# Patient Record
Sex: Female | Born: 1965 | Race: Asian | Hispanic: No | Marital: Single | Smoking: Never smoker
Health system: Southern US, Community
[De-identification: ages and names within clinical notes are randomized; demographics above are authoritative.]

## PROBLEM LIST (undated history)

## (undated) DIAGNOSIS — K219 Gastro-esophageal reflux disease without esophagitis: Secondary | ICD-10-CM

## (undated) DIAGNOSIS — K2970 Gastritis, unspecified, without bleeding: Secondary | ICD-10-CM

## (undated) DIAGNOSIS — M545 Low back pain, unspecified: Secondary | ICD-10-CM

## (undated) DIAGNOSIS — G4700 Insomnia, unspecified: Secondary | ICD-10-CM

## (undated) DIAGNOSIS — T7840XA Allergy, unspecified, initial encounter: Secondary | ICD-10-CM

## (undated) DIAGNOSIS — Z8619 Personal history of other infectious and parasitic diseases: Secondary | ICD-10-CM

## (undated) DIAGNOSIS — R5600 Simple febrile convulsions: Secondary | ICD-10-CM

## (undated) DIAGNOSIS — Z8719 Personal history of other diseases of the digestive system: Secondary | ICD-10-CM

## (undated) DIAGNOSIS — K648 Other hemorrhoids: Secondary | ICD-10-CM

## (undated) DIAGNOSIS — N809 Endometriosis, unspecified: Secondary | ICD-10-CM

## (undated) DIAGNOSIS — R55 Syncope and collapse: Secondary | ICD-10-CM

## (undated) HISTORY — DX: Endometriosis, unspecified: N80.9

## (undated) HISTORY — DX: Other hemorrhoids: K64.8

## (undated) HISTORY — DX: Personal history of other diseases of the digestive system: Z87.19

## (undated) HISTORY — DX: Syncope and collapse: R55

## (undated) HISTORY — DX: Low back pain: M54.5

## (undated) HISTORY — DX: Simple febrile convulsions: R56.00

## (undated) HISTORY — DX: Gastritis, unspecified, without bleeding: K29.70

## (undated) HISTORY — DX: Low back pain, unspecified: M54.50

## (undated) HISTORY — PX: ABDOMINAL HYSTERECTOMY: SHX81

## (undated) HISTORY — DX: Allergy, unspecified, initial encounter: T78.40XA

## (undated) HISTORY — DX: Personal history of other infectious and parasitic diseases: Z86.19

## (undated) HISTORY — DX: Gastro-esophageal reflux disease without esophagitis: K21.9

## (undated) HISTORY — DX: Insomnia, unspecified: G47.00

---

## 2001-12-12 ENCOUNTER — Other Ambulatory Visit: Admission: RE | Admit: 2001-12-12 | Discharge: 2001-12-12 | Payer: Self-pay | Admitting: Obstetrics and Gynecology

## 2003-03-05 ENCOUNTER — Other Ambulatory Visit: Admission: RE | Admit: 2003-03-05 | Discharge: 2003-03-05 | Payer: Self-pay | Admitting: Obstetrics and Gynecology

## 2004-09-21 HISTORY — PX: LAPAROSCOPY: SHX197

## 2005-05-04 ENCOUNTER — Other Ambulatory Visit: Admission: RE | Admit: 2005-05-04 | Discharge: 2005-05-04 | Payer: Self-pay | Admitting: Obstetrics and Gynecology

## 2005-06-02 ENCOUNTER — Encounter (INDEPENDENT_AMBULATORY_CARE_PROVIDER_SITE_OTHER): Payer: Self-pay | Admitting: *Deleted

## 2005-06-02 ENCOUNTER — Ambulatory Visit (HOSPITAL_COMMUNITY): Admission: RE | Admit: 2005-06-02 | Discharge: 2005-06-02 | Payer: Self-pay | Admitting: Obstetrics and Gynecology

## 2005-06-17 ENCOUNTER — Ambulatory Visit: Payer: Self-pay | Admitting: Internal Medicine

## 2005-07-20 ENCOUNTER — Ambulatory Visit: Payer: Self-pay | Admitting: Internal Medicine

## 2005-08-14 ENCOUNTER — Ambulatory Visit (HOSPITAL_COMMUNITY): Admission: RE | Admit: 2005-08-14 | Discharge: 2005-08-14 | Payer: Self-pay | Admitting: Gastroenterology

## 2005-09-04 ENCOUNTER — Encounter (INDEPENDENT_AMBULATORY_CARE_PROVIDER_SITE_OTHER): Payer: Self-pay | Admitting: *Deleted

## 2005-09-04 ENCOUNTER — Ambulatory Visit (HOSPITAL_COMMUNITY): Admission: RE | Admit: 2005-09-04 | Discharge: 2005-09-04 | Payer: Self-pay | Admitting: Gastroenterology

## 2005-09-04 LAB — HM COLONOSCOPY

## 2005-09-10 ENCOUNTER — Ambulatory Visit: Payer: Self-pay | Admitting: Internal Medicine

## 2005-09-13 ENCOUNTER — Encounter: Admission: RE | Admit: 2005-09-13 | Discharge: 2005-09-13 | Payer: Self-pay | Admitting: Internal Medicine

## 2006-02-04 ENCOUNTER — Ambulatory Visit: Payer: Self-pay | Admitting: Internal Medicine

## 2006-02-09 ENCOUNTER — Ambulatory Visit: Payer: Self-pay | Admitting: Internal Medicine

## 2006-03-17 ENCOUNTER — Ambulatory Visit: Payer: Self-pay | Admitting: Internal Medicine

## 2006-05-13 ENCOUNTER — Other Ambulatory Visit: Admission: RE | Admit: 2006-05-13 | Discharge: 2006-05-13 | Payer: Self-pay | Admitting: Obstetrics and Gynecology

## 2006-06-17 ENCOUNTER — Ambulatory Visit: Payer: Self-pay | Admitting: Internal Medicine

## 2006-07-01 ENCOUNTER — Ambulatory Visit: Payer: Self-pay | Admitting: Internal Medicine

## 2007-07-23 LAB — CONVERTED CEMR LAB

## 2007-08-10 ENCOUNTER — Encounter: Admission: RE | Admit: 2007-08-10 | Discharge: 2007-08-10 | Payer: Self-pay | Admitting: Obstetrics and Gynecology

## 2007-12-03 ENCOUNTER — Encounter: Payer: Self-pay | Admitting: *Deleted

## 2007-12-03 DIAGNOSIS — R5600 Simple febrile convulsions: Secondary | ICD-10-CM | POA: Insufficient documentation

## 2007-12-03 DIAGNOSIS — Z87448 Personal history of other diseases of urinary system: Secondary | ICD-10-CM | POA: Insufficient documentation

## 2007-12-03 DIAGNOSIS — Z862 Personal history of diseases of the blood and blood-forming organs and certain disorders involving the immune mechanism: Secondary | ICD-10-CM

## 2007-12-03 DIAGNOSIS — Z8719 Personal history of other diseases of the digestive system: Secondary | ICD-10-CM | POA: Insufficient documentation

## 2007-12-03 DIAGNOSIS — M545 Low back pain, unspecified: Secondary | ICD-10-CM | POA: Insufficient documentation

## 2007-12-03 DIAGNOSIS — G4700 Insomnia, unspecified: Secondary | ICD-10-CM | POA: Insufficient documentation

## 2007-12-03 DIAGNOSIS — R55 Syncope and collapse: Secondary | ICD-10-CM

## 2008-04-12 ENCOUNTER — Ambulatory Visit: Payer: Self-pay | Admitting: Internal Medicine

## 2008-04-12 LAB — CONVERTED CEMR LAB
ALT: 16 units/L (ref 0–35)
AST: 19 units/L (ref 0–37)
Albumin: 3.8 g/dL (ref 3.5–5.2)
Alkaline Phosphatase: 38 units/L — ABNORMAL LOW (ref 39–117)
BUN: 11 mg/dL (ref 6–23)
Bacteria, UA: NEGATIVE
Basophils Absolute: 0 10*3/uL (ref 0.0–0.1)
Basophils Relative: 0.8 % (ref 0.0–3.0)
Bilirubin Urine: NEGATIVE
Bilirubin, Direct: 0.1 mg/dL (ref 0.0–0.3)
CO2: 29 meq/L (ref 19–32)
Calcium: 8.5 mg/dL (ref 8.4–10.5)
Chloride: 102 meq/L (ref 96–112)
Cholesterol: 202 mg/dL (ref 0–200)
Creatinine, Ser: 0.7 mg/dL (ref 0.4–1.2)
Crystals: NEGATIVE
Direct LDL: 123.3 mg/dL
Eosinophils Absolute: 0.2 10*3/uL (ref 0.0–0.7)
Eosinophils Relative: 3 % (ref 0.0–5.0)
GFR calc Af Amer: 119 mL/min
GFR calc non Af Amer: 99 mL/min
Glucose, Bld: 93 mg/dL (ref 70–99)
HCT: 38.6 % (ref 36.0–46.0)
HDL: 71.6 mg/dL (ref >39.0)
Hemoglobin: 13.4 g/dL (ref 12.0–15.0)
Ketones, ur: NEGATIVE mg/dL
Leukocytes, UA: NEGATIVE
Lymphocytes Relative: 33.9 % (ref 12.0–46.0)
MCHC: 34.8 g/dL (ref 30.0–36.0)
MCV: 91.7 fL (ref 78.0–100.0)
Monocytes Absolute: 0.5 10*3/uL (ref 0.1–1.0)
Monocytes Relative: 8.4 % (ref 3.0–12.0)
Mucus, UA: NEGATIVE
Neutro Abs: 3.2 10*3/uL (ref 1.4–7.7)
Neutrophils Relative %: 53.9 % (ref 43.0–77.0)
Nitrite: NEGATIVE
Platelets: 254 10*3/uL (ref 150–400)
Potassium: 4.1 meq/L (ref 3.5–5.1)
RBC: 4.21 M/uL (ref 3.87–5.11)
RDW: 11.8 % (ref 11.5–14.6)
Sodium: 138 meq/L (ref 135–145)
Specific Gravity, Urine: <=1.005 (ref 1.000–1.03)
TSH: 3.49 microintl units/mL (ref 0.35–5.50)
Total Bilirubin: 0.7 mg/dL (ref 0.3–1.2)
Total CHOL/HDL Ratio: 2.8
Total Protein, Urine: NEGATIVE mg/dL
Total Protein: 7 g/dL (ref 6.0–8.3)
Triglycerides: 62 mg/dL (ref 0–149)
Urine Glucose: NEGATIVE mg/dL
Urobilinogen, UA: 0.2 (ref 0.0–1.0)
VLDL: 12 mg/dL (ref 0–40)
WBC, UA: NONE SEEN cells/hpf
WBC: 5.9 10*3/uL (ref 4.5–10.5)
pH: 7 (ref 5.0–8.0)

## 2008-04-17 ENCOUNTER — Ambulatory Visit: Payer: Self-pay | Admitting: Internal Medicine

## 2008-08-10 ENCOUNTER — Encounter: Admission: RE | Admit: 2008-08-10 | Discharge: 2008-08-10 | Payer: Self-pay | Admitting: Obstetrics and Gynecology

## 2008-09-06 LAB — HM MAMMOGRAPHY: HM Mammogram: NORMAL

## 2008-12-20 HISTORY — PX: ROBOTIC ASSISTED LAP VAGINAL HYSTERECTOMY: SHX2362

## 2009-01-08 ENCOUNTER — Encounter (INDEPENDENT_AMBULATORY_CARE_PROVIDER_SITE_OTHER): Payer: Self-pay | Admitting: Obstetrics and Gynecology

## 2009-01-08 ENCOUNTER — Ambulatory Visit (HOSPITAL_COMMUNITY): Admission: RE | Admit: 2009-01-08 | Discharge: 2009-01-08 | Payer: Self-pay | Admitting: Obstetrics and Gynecology

## 2009-01-10 ENCOUNTER — Inpatient Hospital Stay (HOSPITAL_COMMUNITY): Admission: AD | Admit: 2009-01-10 | Discharge: 2009-01-10 | Payer: Self-pay | Admitting: Obstetrics and Gynecology

## 2009-02-26 ENCOUNTER — Encounter: Payer: Self-pay | Admitting: Internal Medicine

## 2009-03-15 ENCOUNTER — Encounter: Payer: Self-pay | Admitting: Internal Medicine

## 2009-03-15 ENCOUNTER — Ambulatory Visit (HOSPITAL_COMMUNITY): Admission: RE | Admit: 2009-03-15 | Discharge: 2009-03-15 | Payer: Self-pay | Admitting: Gastroenterology

## 2009-04-19 ENCOUNTER — Ambulatory Visit: Payer: Self-pay | Admitting: Internal Medicine

## 2009-04-19 LAB — CONVERTED CEMR LAB
ALT: 19 units/L (ref 0–35)
AST: 17 units/L (ref 0–37)
Albumin: 4.1 g/dL (ref 3.5–5.2)
Alkaline Phosphatase: 44 units/L (ref 39–117)
BUN: 13 mg/dL (ref 6–23)
Basophils Absolute: 0 10*3/uL (ref 0.0–0.1)
Basophils Relative: 0.1 % (ref 0.0–3.0)
Bilirubin Urine: NEGATIVE
Bilirubin, Direct: 0.1 mg/dL (ref 0.0–0.3)
CO2: 28 meq/L (ref 19–32)
Calcium: 9 mg/dL (ref 8.4–10.5)
Chloride: 103 meq/L (ref 96–112)
Cholesterol: 203 mg/dL — ABNORMAL HIGH (ref 0–200)
Creatinine, Ser: 0.6 mg/dL (ref 0.4–1.2)
Direct LDL: 124.4 mg/dL
Eosinophils Absolute: 0.3 10*3/uL (ref 0.0–0.7)
Eosinophils Relative: 4.6 % (ref 0.0–5.0)
GFR calc non Af Amer: 116.61 mL/min (ref >60)
Glucose, Bld: 80 mg/dL (ref 70–99)
HCT: 39.2 % (ref 36.0–46.0)
HDL: 65.2 mg/dL (ref >39.00)
Hemoglobin: 13.3 g/dL (ref 12.0–15.0)
Ketones, ur: NEGATIVE mg/dL
Lymphocytes Relative: 32.5 % (ref 12.0–46.0)
Lymphs Abs: 2.1 10*3/uL (ref 0.7–4.0)
MCHC: 33.8 g/dL (ref 30.0–36.0)
MCV: 91.1 fL (ref 78.0–100.0)
Monocytes Absolute: 0.5 10*3/uL (ref 0.1–1.0)
Monocytes Relative: 7.3 % (ref 3.0–12.0)
Neutro Abs: 3.7 10*3/uL (ref 1.4–7.7)
Neutrophils Relative %: 55.5 % (ref 43.0–77.0)
Nitrite: NEGATIVE
Platelets: 278 10*3/uL (ref 150.0–400.0)
Potassium: 3.8 meq/L (ref 3.5–5.1)
RBC: 4.3 M/uL (ref 3.87–5.11)
RDW: 12.7 % (ref 11.5–14.6)
Sodium: 142 meq/L (ref 135–145)
Specific Gravity, Urine: <=1.005 (ref 1.000–1.030)
TSH: 3.12 microintl units/mL (ref 0.35–5.50)
Total Bilirubin: 1 mg/dL (ref 0.3–1.2)
Total CHOL/HDL Ratio: 3
Total Protein, Urine: NEGATIVE mg/dL
Total Protein: 7.5 g/dL (ref 6.0–8.3)
Triglycerides: 69 mg/dL (ref 0.0–149.0)
Urine Glucose: NEGATIVE mg/dL
Urobilinogen, UA: 0.2 (ref 0.0–1.0)
VLDL: 13.8 mg/dL (ref 0.0–40.0)
WBC: 6.6 10*3/uL (ref 4.5–10.5)
pH: 7 (ref 5.0–8.0)

## 2009-04-26 ENCOUNTER — Ambulatory Visit: Payer: Self-pay | Admitting: Internal Medicine

## 2009-04-26 DIAGNOSIS — R6881 Early satiety: Secondary | ICD-10-CM | POA: Insufficient documentation

## 2009-04-26 DIAGNOSIS — N809 Endometriosis, unspecified: Secondary | ICD-10-CM | POA: Insufficient documentation

## 2009-08-12 ENCOUNTER — Encounter: Admission: RE | Admit: 2009-08-12 | Discharge: 2009-08-12 | Payer: Self-pay | Admitting: Obstetrics and Gynecology

## 2009-11-04 ENCOUNTER — Ambulatory Visit: Payer: Self-pay | Admitting: Internal Medicine

## 2009-11-04 LAB — CONVERTED CEMR LAB
Bilirubin Urine: NEGATIVE
Glucose, Urine, Semiquant: NEGATIVE
Ketones, urine, test strip: NEGATIVE
Nitrite: NEGATIVE
Protein, U semiquant: NEGATIVE
Specific Gravity, Urine: <1.005
Urobilinogen, UA: 0.2
WBC Urine, dipstick: NEGATIVE
pH: 5

## 2009-12-06 ENCOUNTER — Ambulatory Visit: Payer: Self-pay | Admitting: Internal Medicine

## 2009-12-06 LAB — CONVERTED CEMR LAB
Bilirubin Urine: NEGATIVE
Bilirubin Urine: NEGATIVE
Blood in Urine, dipstick: NEGATIVE
Glucose, Urine, Semiquant: NEGATIVE
Hemoglobin, Urine: NEGATIVE
Ketones, ur: NEGATIVE mg/dL
Ketones, urine, test strip: NEGATIVE
Leukocytes, UA: NEGATIVE
Nitrite: NEGATIVE
Nitrite: NEGATIVE
Protein, U semiquant: NEGATIVE
Specific Gravity, Urine: <1.005
Specific Gravity, Urine: 1.01 (ref 1.000–1.030)
Total Protein, Urine: NEGATIVE mg/dL
Urine Glucose: NEGATIVE mg/dL
Urobilinogen, UA: 0.2
Urobilinogen, UA: 0.2 (ref 0.0–1.0)
WBC Urine, dipstick: NEGATIVE
pH: 7
pH: 7 (ref 5.0–8.0)

## 2009-12-08 ENCOUNTER — Encounter: Payer: Self-pay | Admitting: Internal Medicine

## 2009-12-17 ENCOUNTER — Ambulatory Visit: Payer: Self-pay | Admitting: Internal Medicine

## 2010-04-28 ENCOUNTER — Telehealth: Payer: Self-pay | Admitting: Internal Medicine

## 2010-04-29 ENCOUNTER — Ambulatory Visit: Payer: Self-pay | Admitting: Internal Medicine

## 2010-04-29 LAB — CONVERTED CEMR LAB
ALT: 14 units/L (ref 0–35)
AST: 18 units/L (ref 0–37)
Albumin: 4.3 g/dL (ref 3.5–5.2)
Alkaline Phosphatase: 33 units/L — ABNORMAL LOW (ref 39–117)
BUN: 10 mg/dL (ref 6–23)
Basophils Absolute: 0 10*3/uL (ref 0.0–0.1)
Basophils Relative: 1 % (ref 0.0–3.0)
Bilirubin Urine: NEGATIVE
Bilirubin, Direct: 0.1 mg/dL (ref 0.0–0.3)
CO2: 29 meq/L (ref 19–32)
Calcium: 9.2 mg/dL (ref 8.4–10.5)
Chloride: 101 meq/L (ref 96–112)
Cholesterol: 218 mg/dL — ABNORMAL HIGH (ref 0–200)
Creatinine, Ser: 0.6 mg/dL (ref 0.4–1.2)
Direct LDL: 128.5 mg/dL
Eosinophils Absolute: 0.1 10*3/uL (ref 0.0–0.7)
Eosinophils Relative: 2.7 % (ref 0.0–5.0)
GFR calc non Af Amer: 107.71 mL/min (ref >60)
Glucose, Bld: 89 mg/dL (ref 70–99)
HCT: 38.5 % (ref 36.0–46.0)
HDL: 75.8 mg/dL (ref >39.00)
Hemoglobin, Urine: NEGATIVE
Hemoglobin: 13.2 g/dL (ref 12.0–15.0)
Ketones, ur: NEGATIVE mg/dL
Leukocytes, UA: NEGATIVE
Lymphocytes Relative: 34.2 % (ref 12.0–46.0)
Lymphs Abs: 1.7 10*3/uL (ref 0.7–4.0)
MCHC: 34.4 g/dL (ref 30.0–36.0)
MCV: 93.3 fL (ref 78.0–100.0)
Monocytes Absolute: 0.5 10*3/uL (ref 0.1–1.0)
Monocytes Relative: 10.7 % (ref 3.0–12.0)
Neutro Abs: 2.6 10*3/uL (ref 1.4–7.7)
Neutrophils Relative %: 51.4 % (ref 43.0–77.0)
Nitrite: NEGATIVE
Platelets: 261 10*3/uL (ref 150.0–400.0)
Potassium: 4.3 meq/L (ref 3.5–5.1)
RBC: 4.13 M/uL (ref 3.87–5.11)
RDW: 13.4 % (ref 11.5–14.6)
Sodium: 141 meq/L (ref 135–145)
Specific Gravity, Urine: <=1.005 (ref 1.000–1.030)
TSH: 2.81 microintl units/mL (ref 0.35–5.50)
Total Bilirubin: 0.5 mg/dL (ref 0.3–1.2)
Total CHOL/HDL Ratio: 3
Total Protein, Urine: NEGATIVE mg/dL
Total Protein: 7.1 g/dL (ref 6.0–8.3)
Triglycerides: 52 mg/dL (ref 0.0–149.0)
Urine Glucose: NEGATIVE mg/dL
Urobilinogen, UA: 0.2 (ref 0.0–1.0)
VLDL: 10.4 mg/dL (ref 0.0–40.0)
Vit D, 25-Hydroxy: 36 ng/mL (ref 30–89)
WBC: 5 10*3/uL (ref 4.5–10.5)
pH: 7 (ref 5.0–8.0)

## 2010-05-05 ENCOUNTER — Ambulatory Visit: Payer: Self-pay | Admitting: Internal Medicine

## 2010-05-06 DIAGNOSIS — R1031 Right lower quadrant pain: Secondary | ICD-10-CM

## 2010-08-13 ENCOUNTER — Encounter: Admission: RE | Admit: 2010-08-13 | Discharge: 2010-08-13 | Payer: Self-pay | Admitting: Obstetrics and Gynecology

## 2010-10-13 ENCOUNTER — Encounter: Payer: Self-pay | Admitting: Gastroenterology

## 2010-10-21 NOTE — Assessment & Plan Note (Signed)
Summary: cpx-lb   Vital Signs:  Patient profile:   45 year old female Height:      64 inches Weight:      103 pounds BMI:     17.74 O2 Sat:      98 % on Room air Temp:     98.3 degrees F oral Pulse rate:   80 / minute BP sitting:   90 / 60  (left arm) Cuff size:   regular  Vitals Entered By: Bill Salinas CMA (May 05, 2010 2:19 PM)  O2 Flow:  Room air CC: cpx/ ab  Vision Screening:      Vision Comments: Last eye exam Aug, 09, 2011 normal exam with Dr Hyacinth Meeker   Primary Care Demaya Hardge:  Jacques Navy MD  CC:  cpx/ ab.  History of Present Illness: Ms. Vacca presents for general medical exam. She has been doing better: her RLQ [facility] with pelvic floor muscle training which has helped perhaps 50%. She was able to get a stool for sitting at work, which has also helped.  she is medically stable. She does see her Gynecologist in the fall for pelvic and breast exam.   Current Medications (verified): 1)  Multivitamins   Tabs (Multiple Vitamin) .... Take Once Daily 2)  Vitamin C 500 Mg  Tabs (Ascorbic Acid) .... Take 1 Tablet By Mouth Once A Day 3)  Calcium 600 Mg  Tabs (Calcium) .... Take 1 Tablet By Mouth Once A Day 4)  Vitamin E 400 Unit  Caps (Vitamin E) .... Take 1 Tablet By Mouth Once A Day 5)  Vitamin D 1000 Unit Tabs (Cholecalciferol) .Marland Kitchen.. 1 Tab Daily  Allergies (verified): 1)  ! 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 UTI'S, HX OF (ICD-V13.00) Hx of VASOVAGAL SYNCOPE (ICD-780.2) SEIZURES, FEBRILE  (ICD-780.31) MUMPS, HX OF AS A CHILD (ICD-V12.09) MEASLES, HX OF AS A CHILD (ICD-V12.09)   Physician Roster:          Gyn - Dr. Estanislado Pandy          GI-  Dr. Ranae Palms          Opthal - Sally MIlled, OD                 Past Surgical History: Last updated: 05-08-09 * LAPAROSCOPY FOR ENDOMETRIOSIS WITH SCAR TISSUE. '06 Hysterectomy - robotic assisted April '10  Family History: Last updated: 2009/05/08 father - deceased @ 87: esophageal Cancer; DM, schizophrenia mother - 1934: Arthritis, CAD, HTN, DM PAunt - colon Cancer MAunt - breast cancer Brother -asthma Sister - allergic rhinnitis and allergies Brother - h/o substance use- DM, chol  Social History: Last updated: May 08, 2009 one of 4 children with a twin brother HSG, BA-pscyh/counseling single; shares a house with sister. 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  The patient denies anorexia, fever, weight loss, weight  gain, vision loss, decreased hearing, chest pain, dyspnea on exertion, prolonged cough, melena, hematochezia, severe indigestion/heartburn, incontinence, muscle weakness, suspicious skin lesions, difficulty walking, depression, abnormal bleeding, enlarged lymph nodes, and breast masses.    Physical Exam  General:  very slender woman in no distress Head:  Normocephalic and atraumatic without obvious abnormalities. No apparent alopecia or balding. Eyes:  No corneal or conjunctival inflammation noted. EOMI. Perrla. Funduscopic exam benign, without hemorrhages, exudates or papilledema. Vision grossly normal. Ears:  External ear exam shows no significant lesions or deformities.  Otoscopic examination reveals clear  canals, tympanic membranes are intact bilaterally without bulging, retraction, inflammation or discharge. Hearing is grossly normal bilaterally. Nose:  no external deformity and no external erythema.   Mouth:  Oral mucosa and oropharynx without lesions or exudates.  Teeth in good repair. Neck:  supple, no thyromegaly, and no carotid bruits.   Chest Wall:  No deformities, masses, or tenderness noted. Breasts:  deferred to gyn  Lungs:  Normal respiratory effort, chest expands symmetrically. Lungs are clear to auscultation, no crackles or wheezes. Heart:  Normal rate and regular rhythm. S1 and S2 normal without gallop, murmur, click, rub or other extra sounds. Abdomen:  soft, non-tender, no distention, no masses, no guarding, and no rigidity.   Genitalia:  deferred to gyn Msk:  normal ROM, no joint tenderness, no joint swelling, no joint warmth, no redness over joints, and no joint instability.   Pulses:  2+ radial and DP pulses Extremities:  No clubbing, cyanosis, edema, or deformity noted with normal full range of motion of all joints.   Neurologic:  alert & oriented X3, cranial nerves II-XII intact, strength normal in all extremities, gait normal, and DTRs symmetrical and normal.   Skin:  turgor normal, color normal, and no rashes.  Thinning of lateral eyebrow hair Cervical Nodes:  no anterior cervical adenopathy and no posterior cervical adenopathy.   Psych:  Oriented X3, memory intact for recent and remote, normally interactive, and good eye contact.     Impression & Recommendations:  Problem # 1:  INSOMNIA (ICD-780.52) stable with no voiced complaint today  Problem # 2:  Hx of VASOVAGAL SYNCOPE (ICD-780.2) No reported recurrence  Problem # 3:  ABDOMINAL PAIN, RIGHT LOWER QUADRANT (ICD-789.03) Chronic pain - full eval, including gyn work-up, unrevealing. she is doing better with pelvic floor therapy. There may be other musculo-skieletal involvement, i.e. piriformis or psoas strain.    Plan - additional physical therapy - Rx for Intergrative Therapies provided.   Problem # 4:  Preventive Health Care (ICD-V70.0) Interval history is benign. Exam is normal. Lab report is within normal limits. Patient is current with gyn and breast health.  In summary - a very nice woman who appears to be medically stable. she is encouraged to continue with pelvic floor strengthening and to consider additional PT. She will return as needed.   Complete Medication List: 1)  Multivitamins Tabs (Multiple vitamin) .... Take once daily 2)  Vitamin C 500 Mg Tabs (Ascorbic acid) .... Take 1 tablet by mouth once a day 3)  Calcium 600 Mg Tabs (Calcium) .... Take 1 tablet by mouth once a day 4)  Vitamin E 400 Unit Caps (Vitamin e) .... Take 1 tablet by mouth once a day 5)  Vitamin D 1000 Unit Tabs (Cholecalciferol) .Marland Kitchen.. 1 tab daily  Patient: [name] [name] Note: All result statuses are Final unless otherwise noted.  Tests: (1) Lipid Panel (LIPID)   Cholesterol          [  H]  218 mg/dL                   1-610     ATP III Classification            Desirable:  < 200 mg/dL                    Borderline High:  200 - 239 mg/dL               High:  > = 240 mg/dL   Triglycerides             52.0 mg/dL                  9.6-045.4     Normal:  <150 mg/dL     Borderline High:  098 - 199 mg/dL   HDL                       11.91 mg/dL                 >47.82   VLDL Cholesterol          10.4 mg/dL                  9.5-62.1  CHO/HDL Ratio:  CHD Risk                             3                    Men          Women     1/2 Average Risk     3.4          3.3     Average Risk          5.0          4.4     2X Average Risk          9.6          7.1     3X Average Risk          15.0          11.0                           Tests: (2) BMP (METABOL)   Sodium                    141 mEq/L                   135-145   Potassium                 4.3 mEq/L                   3.5-5.1   Chloride                  101 mEq/L                    96-112   Carbon Dioxide            29 mEq/L                    19-32   Glucose  89 mg/dL                    16-10   BUN                       10 mg/dL                    9-60   Creatinine                0.6 mg/dL                   4.5-4.0   Calcium                   9.2 mg/dL                   9.8-11.9   GFR                       107.71 mL/min               >60  Tests: (3) CBC Platelet w/Diff (CBCD)   White Cell Count          5.0 K/uL                    4.5-10.5   Red Cell Count            4.13 Mil/uL                 3.87-5.11   Hemoglobin                13.2 g/dL                   14.7-82.9   Hematocrit                38.5 %                      36.0-46.0   MCV                       93.3 fl                     78.0-100.0   MCHC                      34.4 g/dL                   56.2-13.0   RDW                       13.4 %                      11.5-14.6   Platelet Count            261.0 K/uL                  150.0-400.0   Neutrophil %              51.4 %                      43.0-77.0   Lymphocyte %              34.2 %  12.0-46.0   Monocyte %                10.7 %                      3.0-12.0   Eosinophils%              2.7 %                       0.0-5.0   Basophils %               1.0 %                       0.0-3.0   Neutrophill Absolute      2.6 K/uL                    1.4-7.7   Lymphocyte Absolute       1.7 K/uL                    0.7-4.0   Monocyte Absolute         0.5 K/uL                    0.1-1.0  Eosinophils, Absolute                             0.1 K/uL                    0.0-0.7   Basophils Absolute        0.0 K/uL                    0.0-0.1  Tests: (4) Hepatic/Liver Function Panel (HEPATIC)   Total Bilirubin           0.5 mg/dL                   1.6-1.0   Direct Bilirubin          0.1 mg/dL                   9.6-0.4   Alkaline Phosphatase [L]  33 U/L                      39-117   AST                       18 U/L                       0-37   ALT                       14 U/L                      0-35   Total Protein             7.1 g/dL                    5.4-0.9   Albumin                   4.3 g/dL                    8.1-1.9  Tests: (5)  TSH (TSH)   FastTSH                   2.81 uIU/mL                 0.35-5.50  Tests: (6) UDip Only (UDIP)   Color                     LT. YELLOW       RANGE:  Yellow;Lt. Yellow   Clarity                   CLEAR                       Clear   Specific Gravity          <=1.005                     1.000 - 1.030   Urine Ph                  7.0                         5.0-8.0   Protein                   NEGATIVE                    Negative   Urine Glucose             NEGATIVE                    Negative   Ketones                   NEGATIVE                    Negative   Urine Bilirubin           NEGATIVE                    Negative   Blood                     NEGATIVE                    Negative   Urobilinogen              0.2                         0.0 - 1.0   Leukocyte Esterace        NEGATIVE                    Negative   Nitrite                   NEGATIVE                    Negative  Tests: (7) Cholesterol LDL - Direct (DIRLDL)  Cholesterol LDL - Direct                             128.5 mg/dL     Optimal:  <161 mg/dL     Near or Above Optimal:  100-129 mg/dL     Borderline High:  096-045  mg/dL     High:  161-096 mg/dL     Very High:  >045 mg/dL   Tests: (1) Vitamin D (25-Hydroxy) (40981)  Vitamin D (25-Hydroxy)                             36 ng/mL                    30-89

## 2010-10-21 NOTE — Progress Notes (Signed)
Summary: VIT D LAB ORDER  Phone Note Other Incoming   Caller: pt  Summary of Call: Pt wants Vit D added to her physical labs, Please advise Initial call taken by: Ami Bullins CMA,  April 28, 2010 9:06 AM  Follow-up for Phone Call        OK to add Vit D  780.79 Follow-up by: Jacques Navy MD,  April 28, 2010 9:36 AM  Additional Follow-up for Phone Call Additional follow up Details #1::        PT CALLED BACK.  I ADDED THE VIT D TO HER CPX LABS. Additional Follow-up by: Hilarie Fredrickson,  April 28, 2010 4:44 PM

## 2010-10-21 NOTE — Assessment & Plan Note (Signed)
Summary: CHEST CONGESTION AND TIGHTNESS/NWS   Vital Signs:  Patient profile:   45 year old female Height:      64 inches Weight:      105 pounds BMI:     18.09 O2 Sat:      99 % on Room air Temp:     98.2 degrees F oral Pulse rate:   74 / minute BP sitting:   106 / 70  (left arm) Cuff size:   regular  Vitals Entered By: Bill Salinas CMA (November 04, 2009 1:45 PM)  O2 Flow:  Room air CC: pt here with complaint of chest congestion x 4 days and ?s if she has a urinary tract infection/ ab   Primary Care Provider:  Jacques Navy MD  CC:  pt here with complaint of chest congestion x 4 days and ?s if she has a urinary tract infection/ ab.  History of Present Illness: Had a bad cold: chest congestion, cough - yellowish sputum, no fever, no rigors. Having earache - a pressure like pain.  Having dysuria - especially with end micturition, no frequency. Having some low back pain. Having mild nausea.   Current Medications (verified): 1)  Multivitamins   Tabs (Multiple Vitamin) .... Take Once Daily 2)  Vitamin C 500 Mg  Tabs (Ascorbic Acid) .... Take 1 Tablet By Mouth Once A Day 3)  Calcium 600 Mg  Tabs (Calcium) .... Take 1 Tablet By Mouth Once A Day 4)  Vitamin E 400 Unit  Caps (Vitamin E) .... Take 1 Tablet By Mouth Once A Day  Allergies (verified): 1)  ! Penicillin  Past History:  Past Medical History: Last updated: April 28, 2009 ENDOMETRIOSIS (ICD-617.9) HEALTH MAINTENANCE EXAM (ICD-V70.0) Hx of INTERNAL HEMORRHOIDS SMALL (ICD-455.0) RECTAL BLEEDING, HX OF (ICD-V12.79) ANEMIA, MILD, HX OF (ICD-V12.3) HIATAL HERNIA, HX OF SMALL (ICD-V12.79) Hx of POSITIVE PPD (ICD-795.5) INSOMNIA (ICD-780.52) Hx of LOW BACK PAIN, MILD (ICD-724.2) GASTRITIS, HX OF (ICD-V12.79) * LAPAROSCOPY FOR ENDOMETRIOSIS WITH SCAR TISSUE. UTI'S, HX OF (ICD-V13.00) Hx of VASOVAGAL SYNCOPE (ICD-780.2) SEIZURES, FEBRILE (ICD-780.31) MUMPS, HX OF AS A CHILD (ICD-V12.09) MEASLES, HX OF AS A CHILD  (ICD-V12.09)   Physician Roster:          Gyn - Dr. Estanislado Pandy          GI-  Dr. Ranae Palms          Opthal - Sally MIlled, OD                 Past Surgical History: Last updated: 04-28-09 * LAPAROSCOPY FOR ENDOMETRIOSIS WITH SCAR TISSUE. '06 Hysterectomy - robotic assisted April '10  Family History: Last updated: 28-Apr-2009 father - deceased @ 14: esophageal Cancer; DM, schizophrenia mother - 1934: Arthritis, CAD, HTN, DM PAunt - colon Cancer MAunt - breast cancer Brother -asthma Sister - allergic rhinnitis and allergies Brother - h/o substance use- DM, chol  Social History: Last updated: 04-28-09 one of 4 children with a twin brother HSG, BA-pscyh/counseling single; shares a house with sister. 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  The patient denies anorexia, fever, weight loss, dyspnea on exertion, abdominal pain, hematochezia, muscle weakness, difficulty walking, unusual weight change, enlarged lymph nodes,  and angioedema.    Physical Exam  General:  alert, well-developed, and well-nourished.   Head:  normocephalic and atraumatic.   Eyes:  pupils equal, pupils round, and corneas and lenses clear.   Ears:  Left TM is rosy and bulging Nose:  no external deformity and no external erythema.   Neck:  supple and full ROM.   Lungs:  Normal respiratory effort, chest expands symmetrically. Lungs are clear to auscultation, no crackles or wheezes. Heart:  Normal rate and regular rhythm. S1 and S2 normal without gallop, murmur, click, rub or other extra sounds. Abdomen:  tender to palpation right at level of umbilicus. Tender to  palpation in the suprapubic area.  Msk:  normal ROM, no joint warmth, no redness over joints, and no joint instability.   Pulses:  2+ radial Neurologic:  alert & oriented X3, cranial nerves II-XII intact, strength normal in all extremities, and gait normal.   Skin:  turgor normal, color normal, and no rashes.   Cervical Nodes:  no anterior cervical adenopathy and no posterior cervical adenopathy.   Psych:  Oriented X3, memory intact for recent and remote, and normally interactive.     Impression & Recommendations:  Problem # 1:  CYSTITIS, ACUTE (ICD-595.0)  Pain with very suspicious symptoms for UTI despite negative dip UA vs interstial cystitis.  Plan - TMPSMX two times a day x 7 days  Her updated medication list for this problem includes:    Sulfamethoxazole-trimethoprim 400-80 Mg Tabs (Sulfamethoxazole-trimethoprim) .Marland Kitchen... 1 by mouth two times a day x 7 for probable uti  Orders: Prescription Created Electronically [phone])  Problem # 2:  URI (ICD-465.9)  URI symptoms but no evidence of bacterial infection.  Plan - antitussive of choice - with guafenesin           pseudoephedrine 30mg  two times a day or three times a day for facial pain           supportive care.   Orders: Prescription Created Electronically [phone])  Complete Medication List: 1)  Multivitamins Tabs (Multiple vitamin) .... Take once daily 2)  Vitamin C 500 Mg Tabs (Ascorbic acid) .... Take 1 tablet by mouth once a day 3)  Calcium 600 Mg Tabs (Calcium) .... Take 1 tablet by mouth once a day 4)  Vitamin E 400 Unit Caps (Vitamin e) .... Take 1 tablet by mouth once a day 5)  Sulfamethoxazole-trimethoprim 400-80 Mg Tabs (Sulfamethoxazole-trimethoprim) .Marland Kitchen.. 1 by mouth two times a day x 7 for probable uti Prescriptions: SULFAMETHOXAZOLE-TRIMETHOPRIM 400-80 MG TABS (SULFAMETHOXAZOLE-TRIMETHOPRIM) 1 by mouth two times a day x 7 for probable UTI  #14 x 0   Entered and Authorized by:   Jacques Navy MD   Signed by:    Jacques Navy MD on 11/04/2009   Method used:   Electronically to        [facility]  [phone]* (retail)       [address]       [place], [place]  [address]       Ph: [phone] or [phone]       Fax: [phone]   RxID:   [id number]   Laboratory Results   Urine Tests   Date/Time Reported: 11/04/2009 at 1:45pm  Routine Urinalysis   Color: colorless Appearance: Clear Glucose: negative   (Normal Range: Negative) Bilirubin: negative   (Normal Range: Negative) Ketone: negative   (Normal Range: Negative) Spec. Gravity: <1.005   (Normal  Range: 1.003-1.035) Blood: trace-lysed   (Normal Range: Negative) pH: 5.0   (Normal Range: 5.0-8.0) Protein: negative   (Normal Range: Negative) Urobilinogen: 0.2   (Normal Range: 0-1) Nitrite: negative   (Normal Range: Negative) Leukocyte Esterace: negative   (Normal Range: Negative)

## 2010-10-21 NOTE — Assessment & Plan Note (Signed)
Summary: BLOOD IN URINE RECHECK--STC   Vital Signs:  Patient profile:   45 year old female Height:      64 inches Weight:      105 pounds BMI:     18.09 O2 Sat:      98 % on Room air Temp:     98.3 degrees F oral Pulse rate:   70 / minute BP sitting:   100 / 62  (left arm) Cuff size:   regular  Vitals Entered By: Bill Salinas CMA (December 06, 2009 4:36 PM)  O2 Flow:  Room air CC: pt here for follow up to make sure she doesnt have uti/ ab   Primary Care Provider:  Jacques Navy MD  CC:  pt here for follow up to make sure she doesnt have uti/ ab.  History of Present Illness: Needs to have a microscopic u/a to rule out persistent microscopic hematuria  Needs a letter re: inability to stand a full 8 hour shift and needs to have the ability to have a seat as needed. also needs FMLA to allow time away from work for  PT.   Current Medications (verified): 1)  Multivitamins   Tabs (Multiple Vitamin) .... Take Once Daily 2)  Vitamin C 500 Mg  Tabs (Ascorbic Acid) .... Take 1 Tablet By Mouth Once A Day 3)  Calcium 600 Mg  Tabs (Calcium) .... Take 1 Tablet By Mouth Once A Day 4)  Vitamin E 400 Unit  Caps (Vitamin E) .... Take 1 Tablet By Mouth Once A Day  Allergies (verified): 1)  ! Penicillin PMH-FH-SH reviewed-no changes except otherwise noted  Review of Systems       The patient complains of abdominal pain and hematuria.  The patient denies anorexia, fever, weight loss, weight gain, decreased hearing, chest pain, syncope, dyspnea on exertion, prolonged cough, hemoptysis, muscle weakness, difficulty walking, unusual weight change, and angioedema.    Physical Exam  General:  Thin woman in no distress Head:  normocephalic and atraumatic.   Eyes:  corneas and lenses clear and no injection.   Lungs:  normal respiratory effort and normal breath sounds.   Heart:  normal rate and regular rhythm.     Impression & Recommendations:  Problem # 1:  ENDOMETRIOSIS (ICD-617.9) Patient  with on-going intermittent pain and discomfort.  Plan - FMLA to be completed so that she may leave work early if needed to keep PT appointments           Letter to employer requesting that she be allowed to sit as possible.  Problem # 2:  UTI'S, HX OF (ICD-V13.00) Concern for persistent microscopic hematuria.  Plan - lab   Orders: TLB-Udip w/ Micro (81001-URINE)  Patient: [name] [name] Note: All result statuses are Final unless otherwise noted.  Tests: (1) UDip w/Micro (URINE)   Color                     LT. YELLOW       RANGE:  Yellow;Lt. Yellow   Clarity                   CLEAR                       Clear   Specific Gravity          1.010  1.000 - 1.030   Urine Ph                  7.0                         5.0-8.0   Protein                   NEGATIVE                    Negative   Urine Glucose             NEGATIVE                    Negative   Ketones                   NEGATIVE                    Negative   Urine Bilirubin           NEGATIVE                    Negative   Blood                     NEGATIVE                    Negative   Urobilinogen              0.2                         0.0 - 1.0   Leukocyte Esterace        NEGATIVE                    Negative   Nitrite                   NEGATIVE                    Negative   Urine WBC                 0-2/hpf                     0-2/hpf   Urine Epith               Rare(0-4/hpf)               Rare(0-4/hpf)   Urine Bacteria            Rare(<10/hpf)               None  Complete Medication List: 1)  Multivitamins Tabs (Multiple vitamin) .... Take once daily 2)  Vitamin C 500 Mg Tabs (Ascorbic acid) .... Take 1 tablet by mouth once a day 3)  Calcium 600 Mg Tabs (Calcium) .... Take 1 tablet by mouth once a day 4)  Vitamin E 400 Unit Caps (Vitamin e) .... Take 1 tablet by mouth once a day  Laboratory Results   Urine Tests   Date/Time Reported: 12/06/2009  3:45pm  Routine Urinalysis   Color: lt.  yellow Appearance: Clear Glucose: negative   (Normal Range: Negative) Bilirubin: negative   (Normal Range: Negative) Ketone: negative   (Normal Range: Negative) Spec. Gravity: <1.005   (Normal Range: 1.003-1.035) Blood: negative   (Normal  Range: Negative) pH: 7.0   (Normal Range: 5.0-8.0) Protein: negative   (Normal Range: Negative) Urobilinogen: 0.2   (Normal Range: 0-1) Nitrite: negative   (Normal Range: Negative) Leukocyte Esterace: negative   (Normal Range: Negative)

## 2010-10-21 NOTE — Letter (Signed)
[facility] [address] [place], [place]  [address] Phone: [phone]      December 08, 2009   Maryl Akter [address] [place], [place] [address]  RE:  LAB RESULTS  Dear  Ms. Furlan,  The following is an interpretation of your most recent lab tests.  Please take note of any instructions provided or changes to medications that have resulted from your lab work.    U/A negative - see included full report   Sincerely Yours,    Jacques Navy MD  Patient: [name] [name] Note: All result statuses are Final unless otherwise noted.  Tests: (1) UDip w/Micro (URINE)   Color                     LT. YELLOW       RANGE:  Yellow;Lt. Yellow   Clarity                   CLEAR                       Clear   Specific Gravity          1.010                       1.000 - 1.030   Urine Ph                  7.0                         5.0-8.0   Protein                   NEGATIVE                    Negative   Urine Glucose             NEGATIVE                    Negative   Ketones                   NEGATIVE                    Negative   Urine Bilirubin           NEGATIVE                    Negative   Blood                     NEGATIVE                    Negative   Urobilinogen              0.2                         0.0 - 1.0   Leukocyte Esterace        NEGATIVE                    Negative   Nitrite                   NEGATIVE                    Negative   Urine  WBC                 0-2/hpf                     0-2/hpf   Urine Epith               Rare(0-4/hpf)               Rare(0-4/hpf)   Urine Bacteria            Rare(<10/hpf)               None

## 2010-12-22 ENCOUNTER — Ambulatory Visit (INDEPENDENT_AMBULATORY_CARE_PROVIDER_SITE_OTHER): Payer: BC Managed Care – PPO | Admitting: Internal Medicine

## 2010-12-22 VITALS — BP 100/70 | HR 86 | Temp 98.7°F | Wt 103.0 lb

## 2010-12-22 DIAGNOSIS — J309 Allergic rhinitis, unspecified: Secondary | ICD-10-CM

## 2010-12-22 NOTE — Progress Notes (Signed)
  Subjective:    Patient ID: [name] [name], female    DOB: 04/22/1966, 45 y.o.   MRN: [id number]  HPI  [name] [name] presents with a 1 week h/o exacerbation of her alleries with a cough, congestion, ear pressure and sneezing. Sputum is yellowish green, minimal. Better today after peaking over the weekend. Still with ear pressure. Taking claritin and mucex DM with good results. She is traveling and want to be sure that she is safe for flying .     Review of Systems Review of Systems  Constitutional:  Negative for fever, chills, activity change and unexpected weight change.  HENT:  Negative for hearing loss, ear pain, congestion, neck stiffness and postnasal drip.   Eyes: Negative for pain, discharge and visual disturbance.  Respiratory: Negative for chest tightness and wheezing.   Cardiovascular: Negative for chest pain and palpitations.       [No decreased exercise tolerance Gastrointestinal: [No change in bowel habit. No bloating or gas. No reflux or indigestion Genitourinary: Negative for urgency, frequency, flank pain and difficulty urinating.  Musculoskeletal: Negative for myalgias, back pain, arthralgias and gait problem.  Neurological: Negative for dizziness, tremors, weakness and headaches.  Hematological: Negative for adenopathy.  Psychiatric/Behavioral: Negative for behavioral problems and dysphoric mood.       Objective:   Physical Exam  Vitals reviewed. Constitutional: She appears well-developed. No distress.  HENT:  Head: Normocephalic and atraumatic.  Right Ear: Hearing, tympanic membrane, external ear and ear canal normal.  Left Ear: Hearing, tympanic membrane, external ear and ear canal normal.  Eyes: Conjunctivae and EOM are normal. Pupils are equal, round, and reactive to light.  Neck: Neck supple.  Cardiovascular: Normal rate and regular rhythm.   Pulmonary/Chest: Effort normal and breath sounds normal. No respiratory distress. She has no wheezes. She has no  rales.  Lymphadenopathy:       Head (right side): No submental, no submandibular, no tonsillar and no occipital adenopathy present.       Head (left side): No submental, no submandibular, no posterior auricular and no occipital adenopathy present.       Right cervical: No superficial cervical, no deep cervical and no posterior cervical adenopathy present.      Left cervical: No superficial cervical, no deep cervical and no posterior cervical adenopathy present.          Assessment & Plan:  1. Allergic rhinitis - no sing of infection.  Plan- pseudoephedrin 30mg  before flying'            Discussed nasal inhalational steroids - safety and efficacy.

## 2010-12-31 LAB — BASIC METABOLIC PANEL
BUN: 11 mg/dL (ref 6–23)
CO2: 27 mEq/L (ref 19–32)
Calcium: 9 mg/dL (ref 8.4–10.5)
Chloride: 103 mEq/L (ref 96–112)
Creatinine, Ser: 0.71 mg/dL (ref 0.4–1.2)
GFR calc Af Amer: >60 mL/min (ref >60)
GFR calc non Af Amer: >60 mL/min (ref >60)
Glucose, Bld: 93 mg/dL (ref 70–99)
Potassium: 4.1 mEq/L (ref 3.5–5.1)
Sodium: 137 mEq/L (ref 135–145)

## 2010-12-31 LAB — CBC
HCT: 35.7 % — ABNORMAL LOW (ref 36.0–46.0)
HCT: 37.3 % (ref 36.0–46.0)
Hemoglobin: 12.2 g/dL (ref 12.0–15.0)
Hemoglobin: 12.7 g/dL (ref 12.0–15.0)
MCHC: 34.2 g/dL (ref 30.0–36.0)
MCHC: 34 g/dL (ref 30.0–36.0)
MCV: 92.9 fL (ref 78.0–100.0)
MCV: 91.2 fL (ref 78.0–100.0)
Platelets: 232 10*3/uL (ref 150–400)
Platelets: 272 10*3/uL (ref 150–400)
RBC: 3.84 MIL/uL — ABNORMAL LOW (ref 3.87–5.11)
RBC: 4.09 MIL/uL (ref 3.87–5.11)
RDW: 13.2 % (ref 11.5–15.5)
RDW: 13.4 % (ref 11.5–15.5)
WBC: 12.6 10*3/uL — ABNORMAL HIGH (ref 4.0–10.5)
WBC: 7.2 10*3/uL (ref 4.0–10.5)

## 2010-12-31 LAB — PREGNANCY, URINE: Preg Test, Ur: NEGATIVE

## 2011-02-03 NOTE — H&P (Signed)
NAME:  [name] [name], [name] [name]               ACCOUNT NO.:  [id number]   MEDICAL RECORD NO.:  [id number]          PATIENT TYPE:  AMB   LOCATION:  DAY                          FACILITY:  [facility]   PHYSICIAN:  Crist Fat. Rivard, M.D. DATE OF BIRTH:  1966/03/13   DATE OF ADMISSION:  DATE OF DISCHARGE:                              HISTORY & PHYSICAL   HISTORY OF PRESENT ILLNESS:  Ms. Murley is a 45 year old single  female, gravida 0, with a history of endometriosis who presents for a  robotically assisted hysterectomy because of symptomatic uterine  fibroids, symptomatic endometriosis, and chronic pelvic pain.   The patient has a long history of endometriosis along with chronic  pelvic pain which was diagnosed in the late 1980s by laparoscopy.  In  the past, she found relief with danazol, Lupron, and in recent years  birth control pills.  In 2006 due to a recurrence of pelvic pain and  heavy bleeding, patient underwent another diagnostic laparoscopy after  which she resumed birth control pills.  Patient's symptoms abated until  2008 at which time she again experienced heavy bleeding and pelvic pain  and was treated with Lupron 11.25 mg, followed by once again oral  contraceptives.   A sonohysterogram with ultrasound at that time showed that she had a  submucosal fibroid measuring 2.0 x 2.1 x 1.7 cm and a uterus measuring  6.74 x 3.46 x 3.18 cm.  Patient's ovaries bilaterally appeared normal on  that study.  Patient had TSH, complete blood count, and endometrial  biopsy, all of which returned normal.  Patient continued birth control  pills with good results until this past year when she began experiencing  spotting 7 days before and after her 7-day menstrual flow.  For 3 of her  actual menstrual flow days, patient has moderate clotting which requires  her to change a pad every 2 hours.  Additionally, she experiences  cramping which she rates as a 7/10 on a 10-point pain scale, however,  does find  relief with Tylenol (decreasing to 2/10 on a 10-point pain  scale).   Patient denies any urinary tract symptoms, changes in her bowel habits,  nausea, vomiting, or vaginitis symptoms.   A pelvic ultrasound February of 2009 showed her uterus measuring 4.23 x  6.80 x 4.58 cm, a right ovary measuring 2.35 x 3.22 x 3.39 cm, a left  ovary measuring 1.52 x 2.75 x 2.51 cm.  Patient's uterus was noted to be  retroverted and her endometrial thickness was unable to be assessed due  to a midbody submucosal fibroid measuring 2.32 x 2.49 x 2.25 cm.  A  review of both medical and surgical management options was given to  patient.  However, given the protracted nature of her symptoms along  with the chronicity of endometriosis, patient has decided to proceed  with hysterectomy.   PAST MEDICAL HISTORY:   OB HISTORY:  Gravida 0.   GYN HISTORY:  Menarche, 45 years old.  Patient's last menstrual period  was December 10, 2008.  She uses oral contraceptives to manage her bleeding  (patient is celibate).  She denies any history of sexually transmitted  diseases or abnormal Pap smear.  Her last normal Pap smear and mammogram  were both in November of 2009.   MEDICAL HISTORY:  1. Childhood febrile seizures.  2. Mild scoliosis.  3. Hyperprolactinemia (normal prolactin, December 2009).  4. Anal fissure.  5. Syncope.  6. Gastritis.  7. Benign colon polyps.   SURGICAL HISTORY:  1. Laparoscopy in 1993 diagnosed with pelvic adhesions.  2. Laparoscopy in 2006, endometriosis and myomectomy.   Patient denies any problems with anesthesia or history of blood  transfusion.   FAMILY HISTORY:  Breast cancer (maternal aunt), cardiovascular disease,  asthma, and diabetes mellitus.   SOCIAL HISTORY:  Patient is single.  She lives alone and works for  [employer].   HABITS:  She does not use tobacco, illicit drugs, and consumes alcohol  rarely.   CURRENT MEDICATIONS:  Over-the-counter analgesia as needed.   SHE IS  ALLERGIC TO PENICILLIN.   REVIEW OF SYSTEMS:  Denies chest pain, shortness of breath, headache,  vision changes, nausea, vomiting, diarrhea, skin rashes and except as is  mentioned in history of present illness patient's review of systems is  otherwise negative.   PHYSICAL EXAM:  Blood pressure is 130/60.  Weight is 113.  Height is 5  feet 3-1/2 inches tall.  Body mass index is 19.7.  GENERAL EXAM/EAR, NOSE, AND THROAT EXAM:  Pupils are equal.  Hearing is  normal.  Throat is clear.  NECK:  Supple without masses.  There is no thyromegaly or cervical  adenopathy.  HEART:  Regular rate and rhythm.  LUNGS:  Clear.  BACK:  No CVA tenderness.  ABDOMEN:  No tenderness, masses, or organomegaly.  EXTREMITIES:  No clubbing, cyanosis, or edema.  NEUROLOGIC EXAM (CURSORY EXAM):  Within normal limits.  PELVIC EXAM:  EG/BUS is within normal limits.  Vagina is rugous.  Cervix  is nontender without lesions.  Uterus appears normal size, shape, and  consistency with mild tenderness, left greater than right.  Adnexa, no  tenderness or masses.   IMPRESSION:  1. Symptomatic uterine fibroids.  2. Symptomatic endometriosis.  3. Chronic pelvic pain.   DISPOSITION:  A discussion was held with patient regarding the  indications for her procedure along with its risks which include but are  not limited to reaction to anesthesia, damage to adjacent organs,  infection, excessive bleeding, the possibility that her procedure may  require an open abdominal incision, and that her pelvic pain may not be  relieved by this procedure.   Patient was also advised that robotic hysterectomy requires additional  time compared with that of an open abdominal procedure, that she will  experience transient postoperative facial edema, that her hospital stay  is expected to be 1 to 2 days, and that she is expected to return to her  normal activities in 2 to 3 weeks.   Patient was given a copy of the MiraLax bowel prep to  be completed 24  hours prior to her surgical procedure.  Patient has consented to proceed  with robotic-assisted hysterectomy with ovarian preservation at [facility] on January 08, 2009, at 7:30 a.m.      Elmira J. Adline Peals.      Crist Fat Rivard, M.D.  Electronically Signed    EJP/MEDQ  D:  12/31/2008  T:  12/31/2008  Job:  [id number]

## 2011-02-03 NOTE — Op Note (Signed)
NAME:  [name] [name], [name] [name]               ACCOUNT NO.:  [id number]   MEDICAL RECORD NO.:  [id number]          PATIENT TYPE:  AMB   LOCATION:  DAY                          FACILITY:  [facility]   PHYSICIAN:  Crist Fat. Rivard, M.D. DATE OF BIRTH:  09/12/1966   DATE OF PROCEDURE:  01/08/2009  DATE OF DISCHARGE:                               OPERATIVE REPORT   PREOPERATIVE DIAGNOSES:  1. Symptomatic uterine fibroids.  2. Endometriosis.  3. Chronic pelvic pain.   POSTOPERATIVE DIAGNOSES:  1. Symptomatic uterine fibroids.  2. Endometriosis.  3. Chronic pelvic pain.   ANESTHESIA:  General.   PROCEDURE:  Robotically assisted total hysterectomy with left salpingo-  oophorectomy and lysis of adhesions.   SURGEON:  Dr. Estanislado Pandy.   ASSISTANT:  Elmira J. Lowell Guitar, physician's assistant.   ESTIMATED BLOOD LOSS:  Minimal.   DESCRIPTION OF PROCEDURE:  After being informed of the planned procedure  with possible complications, including bleeding, infection, injury to  bowel, bladder or ureters, need for laparotomy, expected recovery time,  expected hospital stay and postoperative transient facial edema,  informed consent was obtained.   The patient was taken to OR #10 and given general anesthesia with  endotracheal intubation without any complication.  She was placed in  lithotomy position on a sticky mattress, arms padded and tucked on each  side with knee-high sequential compressive devices.  She was prepped and  draped in a sterile fashion.  Pelvic exam revealed a retroverted uterus  with a posterior fibroid measuring approximately 3 cm, but mobile.  Overall, the uterine volume is normal.  Both adnexa are not felt and  normal.  We were unable to insert a weighted speculum due to the small  vaginal opening and so using a regular Graves speculum, we were able to  visualize the cervix.  The cervix was grasped with a tenaculum forceps  and the cervix was easily dilated using Hegar dilator until #25,  and  then the uterus was sounded at 8 cm.  A #8 Rumi intrauterine manipulator  was placed with a 3.0 Koh ring and a vaginal occluder. The RUMI's  balloon was inflated with 5 cc of saline. We were able to do this  placement without any episiotomy.  The Koh ring was then sutured to the  cervix with 0 Vicryl and the speculum was removed.   A Foley catheter was then inserted in the bladder in a sterile fashion.   We infiltrated the supraumbilical area with Marcaine 0.25 and performed  a 10-mm vertical incision which was brought down sharply to the fascia.  The fascia was identified and grasped with Kocher forceps and incised  with Mayo scissors and the peritoneum was entered bluntly.  A  pursestring suture of 0 Vicryl was placed on the fascia and a 10-mm  Hassan trocar was easily inserted in the abdominal cavity and held in  place with the pursestring suture.  This allowed for easy insufflation  of a pneumoperitoneum using CO2 at a maximum pressure of 15 mmHg.   The camera was inserted for initial inspection of the abdomino-pelvic  cavity which revealed  a retroverted normal-sized uterus with  endometriosis in the posterior cul-de-sac, as well as on the left  adnexa.A posterior fibroid was noted measuring 3.5 cm.  The appendix was  visualized and normal.   Trocar placement was then decided and we will proceed with a three-arm  technique as opposed to four-arm technique.  We place an 8-mm robotic  trocar on the left and an 8 mm robotic trocar on the right and a 10-mm  patient side assistant trocar on the right, all after infiltrating with  Marcaine 0.25 and placed under direct visualization.  The robot was  docked on the left side of the patient and the whole preparation and  docking was completed in 30 minutes.   In arm #1, we used a monopolar scissor and in arm #2, we used a PK gyrus  forceps.   Console time started at 8:45 a.m.  The left ovary was completely  adherent to the left  posterior wall of the uterus and completely  adherent to the ureter which was easily seen proximal to that area.  We  decided to start working medially to try to free the ovary and get some  sense of where the ureter is going, and this was done sharply using  monopolar scissors and bipolar cauterization.  At some point, we were  able to free the ovary completely from the ureter, keeping it under  direct visualization and this gave [place] a good access to the  infundibulopelvic ligament.  The decision was made to remove that adnexa  due to its involvement with severe endometriosis.  The infundibulopelvic  ligament was then cauterized and sectioned which gave [place] another plane  in the retroperitoneum keeping the ureter under direct visualization.  We were able to now cauterize the round ligament and section it and had  access into the retroperitoneal space, dissecting it anteriorly very  easily.  Unfortunately due to peritoneal adhesions with the left  uterosacral ligament, it was difficult to confirm if the ureter was away  from our site of cauterization and we had to proceed systematically with  lysis of these adhesions to be able to pull down the ureter.  This was  done keeping it under direct visualization.  In that process, we were  able to truly skeletonized all the vessels and to even separate the  uterine vein from the uterine artery.  The vein was grasped and  cauterized and sectioned which gave [place] a little better view on the left  retroperitoneal space and we were now able to continue our dissection of  the bladder by opening the anterior broad ligament sharply.  The bladder  was easily dissected away from the vaginal fornix which was identified  with the [facility] ring.  With pressure applied on the Koh ring, we saw the  ureter fall back and we could see the uterine artery at its ascending  branch completely skeletonized.  This artery was then grasped with the  PK forceps and cauterized.    We then moved to the right side and  cauterized the right tube and  sectioned it, cauterized the right utero-ovarian ligament and sectioned  it and cauterized the right round ligament and sectioned it. This gave  [place] entry into the retroperitoneal space.  A few adhesions with the  peritoneal making a peritoneal fold with the right uterosacral ligament  were easily sectioned, keeping the ureter under direct visualization.  The anterior broad ligament was then opened and we completed the  dissection of the bladder away from the vaginal fornix.  We could now  skeletonize the uterine artery by opening the posterior broad ligament  and keeping the right ureter under direct visualization.  Again with  pressure applied on the  Koh ring, the ureter fell back.  We had a  perfect view of the uterine vessels which were completely skeletonized  and we could grab them with the PK forceps and cauterized them at the  level of the Koh ring in their ascending branch.     The vaginal occluder was then inflated and we performed an anterior  colpotomy using opened monopolar scissors.  We completed that colpotomy  in a 360 degree with the posterior aspect being supra uterosacral  ligament.  The uterus was freed entirely and removed vaginally with its  left adnexa.  The vaginal occluder was reinserted in the vagina to  maintain our pneumoperitoneum and our instruments were changed for a  suture cut in arm #1 and a needle holder in arm #2.  The vaginal cuff  was then closed with figure-of-eight stitches of 0 Vicryl.  We then  profusely irrigated the pelvis and noted satisfactory hemostasis.  We  noted two normal ureters with normal peristaltism and no dilatation.  A  sheet of Interceed was separated in two and placed on the vaginal fornix  and on the left adnexal space to reduce the risk of postoperative  adhesions.  Before doing that, the pneumoperitoneum was removed and we  still saw satisfactory hemostasis  with zero pneumo.   Our instruments were then removed and the robot was undocked.  The  trocars were removed under direct visualization after evacuating the  pneumoperitoneum.   The supraumbilical incision fascia was closed with the previously placed  pursestring suture of 0 Vicryl and the fascia of the right 10-mm trocar  was closed with a figure-of-eight stitch of 0 Vicryl.  The skin of all  incisions was closed with subcuticular suture of 3-0 Monocryl and Steri-  Strips.   The vaginal occluder was removed from the vagina.  A speculum was  inserted and we saw a nice closure of the vaginal vault with no  bleeding.  Due to the small vaginal opening, there was a little bit of  abrasion at the posterior fourchette and a thick coat of Estrace was  applied in that area.   Instruments and sponge count was complete x2.  Estimated blood loss was  minimal.  The procedure was very well tolerated by the patient, who was  taken to the recovery room and will be discharged home in a well and  stable condition.   SPECIMEN:  Uterus, left tube, left ovary sent to pathology, weighed 84  grams.      Crist Fat Rivard, M.D.  Electronically Signed     SAR/MEDQ  D:  01/08/2009  T:  01/08/2009  Job:  [id number]

## 2011-02-06 NOTE — Assessment & Plan Note (Signed)
[facility]                             PRIMARY CARE OFFICE NOTE   NAME:[name] [name] WESTHOFF                      MRN:          [id number]  DATE:07/01/2006                            DOB:          01-27-1966    [name] [name] was last seen on Feb 09, 2006, when she was having recurrent  intermittent episodes of sharp __________  pain in the right groin and  pelvis.  At that point the patient had a negative GYN exam, negative MRI of  the pelvis, negative colonoscopy, and normal lab work.  In the interval she  has been seen by urology and has had normal urodynamics.  She does have a  history of a prior laparoscopy for endometriosis.  The patient reports that  her symptoms persist but only intermittently and are bearable.  She feels  comfortable that significant pathologic abnormalities have been ruled out.  The patient reports she is otherwise doing well and feeling well with no  significant medical problems otherwise noted.   PAST MEDICAL HISTORY:   SURGICAL:  Laparoscopy for endometriosis with scar tissue indicating long-  standing disease.   MEDICAL:  1. Measles and mumps as a child.  2. 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  3. Positive PPD at age 52, and has had two months of treatment with INH.  4. Frequent vasovagal syncopal episodes.  5. Urinary tract infections up to 2 times a year.   CURRENT MEDICATIONS:  1. OCP only.  2. Aciphex p.r.n.  3. Multivitamin.  4. Vitamin C.  5. Calcium.  6. Vitamin E.   HABITS:  Tobacco, none.  Alcohol, rare.   DRUG ALLERGIES:  PENICILLIN CAUSING URTICARIA AND RESPIRATORY INSUFFICIENCY.   FAMILY HISTORY:  Mother with severe arthritis in the past.  Paternal aunt  with colon cancer.  Maternal aunt with breast cancer.  Hyperlipidemia in  both parents.  Mother with heart disease and hypertension.  Father had  diabetes and schizophrenia.  He succumbed to esophageal carcinoma.  Brother  with asthma.  Sister  with allergic rhinitis and allergies.   SOCIAL HISTORY:  This is one of four children with a twin brother, another  brother and a sister.  She holds a Transport planner in Psychology, minor in  counseling.  The patient is currently working for [employer] in [employer], commuting from [place] to [place] which is beginning  to take its toll.  The patient is single.  She shares a home with her  sister.  Her mother lives in [place].  Brother lives in [place].  Another  brother lives overseas.  Family last got together in [place] in 2004.  Hope to get together in this country soon.   REVIEW OF SYSTEMS:  The patient has a 7 pound weight loss that is  intentional.  No other constitutional symptoms.  She has had an eye exam in  the last 12 months.  No ENT, cardiovascular, respiratory problems.  Occasional heartburn, well treated with Aciphex p.r.n.  No GU,  musculoskeletal, dermatologic problems.   PHYSICAL EXAMINATION:  VITAL SIGNS:  Temperature was  99, blood pressure  105/70, pulse 78, weight 112.  GENERAL APPEARANCE:  A slender [place] woman who is in no acute distress  and seems comfortable.  HEENT:  Normocephalic atraumatic.  EACs and TMs were normal.  Oropharynx  with native dentition.  Gums were normal.  No buccal lesions were noted.  Posterior pharynx was clear.  Conjunctivae and sclerae were clear.  Pupils  equal, round, reactive to light and accommodation.  Funduscopic exam with  handheld instrument revealed normal disk margins.  I did not appreciate  vascular abnormalities.  I did not appreciate any retinal deposits,  cholesterol or otherwise.  NECK:  Supple without thyromegaly.  No lymphadenopathy was noted in the  cervical or supraclavicular regions.  CHEST:  No CVA tenderness.  LUNGS:  Clear to auscultation and percussion.  BREAST:  Deferred to gynecology (Naima A. Normand Sloop, M.D.).  ABDOMEN:  Soft.  No guarding, no rebound.  No organomegaly or splenomegaly   was noted.  PELVIC:  Deferred to Dr. Normand Sloop.  EXTREMITIES:  Without clubbing, cyanosis, edema, or deformity.  NEUROLOGIC:  Nonfocal.   LABORATORY:  Hemoglobin 12.8 grams, white count was 5,900 with a normal  differential.  Chemistries with a glucose of 95.  Electrolytes were normal.  Kidney function normal.  Creatinine 0.9.  GFR of 74 ml/min.  Liver functions  were normal.  Thyroid function normal with a TSH of 4.55.  Urinalysis was  unremarkable.   ASSESSMENT/PLAN:  1. Intermittent right lower quadrant abdominal pain.  The patient has had      a very thorough evaluation with no significant pathologic underlying      etiology determined.  Plan:  Watchful waiting and expectant management.  2. Health maintenance.  The patient is current with her gynecologist, last      examination being August 2007.  Her laboratory is excellent.  She has      done a great job of bringing down her cholesterol when the LDL was      140.6 down to 114.  She is encouraged to continue with her exercise      regimen and her diet that includes significant fiber.   The patient is asked to return to see me on an as-needed basis.  I would  recommend another full physical exam in 2-3 years as long as she is seeing  her gynecologist.  I would recommend  followup with a panel for monitoring in one year and if she remains stable  would drop back to any every two to three-year schedule.            ______________________________  Rosalyn Gess. Norins, MD      MEN/MedQ  DD:  07/01/2006  DT:  07/03/2006  Job #:  [id number]   cc:   Ms Jolaine Click

## 2011-02-06 NOTE — Op Note (Signed)
NAME:  [name] [name], [name] [name]               ACCOUNT NO.:  [id number]   MEDICAL RECORD NO.:  [id number]          PATIENT TYPE:  AMB   LOCATION:  SDC                           FACILITY:  [facility]   PHYSICIAN:  Naima A. Dillard, M.D. DATE OF BIRTH:  [birth year]   DATE OF PROCEDURE:  06/02/2005  DATE OF DISCHARGE:                                 OPERATIVE REPORT   PREOPERATIVE DIAGNOSES:  1.  Chronic pelvic pain.  2.  Endometriosis.   POSTOPERATIVE DIAGNOSES:  1.  Endometriosis.  2.  Pelvic adhesions.  3.  Uterine fibroids.   PROCEDURES:  1.  Diagnostic laparoscopy.  2.  Uterine myomectomy.  3.  Lysis of adhesions.  4.  Uterosacral and posterior uterus biopsy of endometriosis.  5.  Repair of cervical laceration.   SURGEON:  Naima A. Normand Sloop, M.D.   ASSISTANTMarquis Lunch. Zenia Resides BLOOD LOSS:  150 mL from the cervical laceration.   IV FLUIDS:  Crystalloid.   COMPLICATIONS:  Cervical laceration from the tenaculum.   SPECIMENS TO PATHOLOGY:  Biopsy, sites of endometriosis, and uterine myoma.   The patient went to the recovery room in stable condition.   PROCEDURE IN DETAIL:  The patient was taken to the operating room, where she  was given general anesthesia, prepped and draped in the normal sterile  fashion.  The bivalve speculum was placed into the vagina.  The anterior lip  of the cervix was grasped with a single-tooth tenaculum.  The acorn  manipulator was placed into the cervical os and attached to the tenaculum.  Attention was then turned to the abdomen, where 10 mL of 0.25% Marcaine was  placed.  The 10 mm infraumbilical incision was made with the scalpel and  carried down to the fascia.  The fascia was incised in the midline.  The  peritoneum was identified, tented up and entered sharply.  Vicryl 0 was  placed along the fascia in a pursestring fashion.  The Hasson was placed.  The abdomen was insufflated with CO2 gas.  The findings noted above were  seen.  The  patient's appendix appeared normal.  Did have some filmy  adhesions around it, but there was no sign of inflammation and the findings  noted above were seen.  A 10 mm trocar was placed in the suprapubic area.  A  Foley catheter was then placed also before the surgery took place.  The 10  mm trocar was placed under direct visualization.  A small 5 mm incision was  then made with the scalpel and 0.25% Marcaine was placed at each incision  site.  The 5 mm trocar was placed under direct visualization.  The patient's  right uterosacral ligament was biopsied using biopsy forceps.  The posterior  segment was also biopsied.  The anterior cul-de-sac was noted to be without  any endometrial lesions.  The patient's right tube and ovary was noted to be  normal without endometriosis.  There was some endometrial implant along the  right uterosacral ligament, which was biopsied and removed.  There was a  little implant along  the posterior side of the uterus, which was biopsied  and removed.  The patient had a small uterine fibroid measuring about 1 cm.  This was ligated with bipolar cautery and removed.  The uterine bed was made  hemostatic with bipolar cautery and the fibroid was placed in the posterior  cul-de-sac.  The patient's right ovary was encased in several adhesions.  The adhesions were lysed both sharply and bluntly using scissors.  There was  no endometriosis along the ovary itself but there was some along the  adhesion, which was removed.  Irrigation was then done to the entire  abdomen.  The appendix was found and found to be normal.  The fibroid was  then removed through the 10 mm trocar site, the suprapubic site.  Everything  was noted to be hemostatic.  All instruments were then removed from the  abdomen under direct visualization of the laparoscope.  The fascia on the  two 10 mm sites, one pursestring suture was already just tied.  The fascia  along the suprapubic site was closed with 0  Vicryl.  The skin was closed in  subcuticular fashion with 3-0 Monocryl.  Sponge, lap and needle counts were  correct x2.  Once the tenaculum and acorn was removed, there was a long  anterior laceration along the anterior aspect of the uterus.  This was made  hemostatic using 0 chromic in figure-of-eight stitches, Monsel's and silver  nitrate.  Hemostasis was assured.  About 150 mL of blood was lost from this  laceration.  The patient went to the recovery room in stable condition.      Naima A. Normand Sloop, M.D.  Electronically Signed     NAD/MEDQ  D:  06/02/2005  T:  06/02/2005  Job:  [id number]

## 2011-02-06 NOTE — Op Note (Signed)
NAME:  [name] [name], [name] [name]               ACCOUNT NO.:  [id number]   MEDICAL RECORD NO.:  [id number]          PATIENT TYPE:  AMB   LOCATION:  ENDO                         FACILITY:  [facility]   PHYSICIAN:  Anselmo Rod, M.D.  DATE OF BIRTH:  [birth year]   DATE OF PROCEDURE:  09/04/2005  DATE OF DISCHARGE:  09/04/2005                                 OPERATIVE REPORT   PROCEDURE PERFORMED:  Esophagogastroduodenoscopy with gastric and small  bowel biopsies.   ENDOSCOPIST:  Anselmo Rod, M.D.   INSTRUMENT USED:  Olympus video panendoscope.   INDICATION FOR PROCEDURE:  A 45 year old [place] female with a history of  rectal bleeding and epigastric pain.  Rule out peptic ulcer disease,  esophagitis, gastritis, etc.  The patient's father died of esophageal  cancer.   PREPROCEDURE PREPARATION:  Informed consent was procured from the patient.  The patient was fasted for eight hours prior to the procedure.   PREPROCEDURE PHYSICAL:  VITAL SIGNS:  The patient had stable vital signs.  NECK:  Supple.  CHEST:  Clear to auscultation.  S1, S2 regular.  ABDOMEN:  Soft with normal bowel sounds.   DESCRIPTION OF PROCEDURE:  The patient was placed in the left lateral  decubitus position and sedated with 60 mcg of fentanyl and 5 mg of Versed in  slow incremental doses.  Once the patient was adequately sedate and  maintained on low-flow oxygen and continuous cardiac monitoring, the Olympus  video panendoscope was advanced through the mouthpiece, over the tongue and  into the esophagus under direct vision.  The entire esophagus appeared  normal with no evidence of ring, stricture, masses, esophagitis or Barrett's  mucosa.  The scope was then advanced into the stomach.  A small hiatal  hernia was seen on high retroflexion.  There was mild diffuse gastritis  noted.  Gastric biopsies were done to rule out the evidence of H. pylori by  pathology.  Small bowel biopsies were done to rule out sprue.  There was no  outlet obstruction.  The small bowel mucosa appeared normal.  The patient  tolerated the procedure well without complications.   IMPRESSION:  1.Normal-appearing esophagus and proximal small bowel.  2.Small hiatal hernia.  3.Diffuse gastritis noted, biopsies done to rule out Helicobacter pylori by  pathology.  4.Small bowel biopsies done to rule out sprue, consistent with the patient's  history of mild anemia.   RECOMMENDATIONS:  1.Await pathology results.  2.Avoid all nonsteroidals for now.  3.Continue PPIs.  4.Proceed with a colonoscopy at this time.  Further recommendations will be  made thereafter.      Anselmo Rod, M.D.  Electronically Signed     JNM/MEDQ  D:  09/04/2005  T:  09/07/2005  Job:  [id number]   cc:   Rosalyn Gess. Norins, M.D. [facility]  [address]  [place]  [place] [address]   Naima A. Normand Sloop, M.D.  Fax: [phone]

## 2011-02-06 NOTE — Op Note (Signed)
NAME:  [name] [name], [name] [name]               ACCOUNT NO.:  [id number]   MEDICAL RECORD NO.:  [id number]          PATIENT TYPE:  AMB   LOCATION:  ENDO                         FACILITY:  [facility]   PHYSICIAN:  Anselmo Rod, M.D.  DATE OF BIRTH:  1966/01/25   DATE OF PROCEDURE:  09/04/2005  DATE OF DISCHARGE:  09/04/2005                                 OPERATIVE REPORT   PROCEDURE:  Colonoscopy with cold biopsies x6.   ENDOSCOPIST:  Anselmo Rod, M.D.   INSTRUMENT:  Olympus videocolonoscope (adjustable pediatric scope).   INDICATIONS FOR PROCEDURE:  A 45 year old [place] female with a history of  rectal bleeding and a family history of colon cancer in a paternal aunt and  a paternal cousin.  The paternal cousin was diagnosed in her late 67s. Th  patient also has a history of mild anemia and right lower quadrant pain,  rule out colonic polyps, masses, etcetera.   PREPROCEDURE PREPARATION:  Informed consent was procured from the patient.  The patient was fasted for 8 hours prior to the procedure and prepped with a  bottle of magnesium citrate in a gallon of GoLYTELY the night prior to the  procedure.  The risks and benefits of the procedure including a 10% missed  rate of cancer and polyps was discussed with the patient as well.   PREPROCEDURE PHYSICAL:  VITAL SIGNS: The patient with stable vital signs.  NECK:  Supple.  CHEST:  Clear to auscultation.  HEART:  S1, S2 regular.  ABDOMEN:  Soft with normal bowel sounds.   DESCRIPTION OF PROCEDURE:  The patient was placed in the left lateral  decubitus position and received an additional 40 mcg of Fentanyl and 5 mg of  Versed after the EGD.  Once the patient was adequately positioned, the  Olympus videocolonoscope was advanced from the rectum to the cecum The  appendiceal orifice and ileocecal valve were visualized and photographed.  The terminal ileum appeared healthy and without lesions.  Mild erythema was  noted at the anal verge, and  biopsies were done to rule out proctitis. Small  internal hemorrhoids were seen on retroflexion.  A small, external  hemorrhoid was noted on anal inspection.  The rest of the exam was  unremarkable.  No masses or polyps were seen. The patient tolerated the  procedure well without complications.  There was a no evidence of  diverticulosis.   IMPRESSION:  1.Normal colonoscopy up to the terminal ileum, except for mild  erythema noted in the rectum.  Biopsy done to rule out proctitis.  2.Small internal hemorrhoids and a small external hemorrhoid noted.   RECOMMENDATIONS:  1.Await pathology results.  2.Repeat colonoscopy in the next 5 years unless the patient develops any  abnormal symptoms in the interim.  3.Further recommendations may be made after the pathology results have been  procured.  4.Anusol suppositories will be prescribed for symptomatic relief with  regards to the hemorrhoids.  5.A high fiber diet with liberal fluid intake has been encourage and stool  softeners like Colace are to be used at bedtime to help with her chronic  constipation.      Anselmo Rod, M.D.  Electronically Signed     JNM/MEDQ  D:  09/04/2005  T:  09/07/2005  Job:  [id number]   cc:   Rosalyn Gess. Norins, M.D. [facility]  [address]  [place]  [place] [address]   Naima A. Normand Sloop, M.D.  Fax: [phone]

## 2011-02-06 NOTE — Letter (Signed)
December 08, 2009    To Whom It May Concern   RE:  [name] [name], [name] [name]  MRN:  [id number]  /  DOB:  04-08-1966   Dear Milford Cage:   [name] is a patient I follow in my practice for general medical  care.   Due to her medical problems involving abdominal and pelvic pain,  prolonged periods of standing exacerbate her symptoms and make her very  uncomfortable.  It would be most appropriate and medically indicated for  her to have a chair in which she may sit whenever it is appropriate in  order to reduce her pain and discomfort.   Thank you very much for your consideration in this matter.   I remain yours truly,    Sincerely,      Rosalyn Gess. Norins, MD  Electronically Signed    MEN/MedQ  DD: 12/08/2009  DT: 12/08/2009  Job #: [id number]

## 2011-02-06 NOTE — H&P (Signed)
NAME:  [name] [name], [name] [name]               ACCOUNT NO.:  [id number]   MEDICAL RECORD NO.:  [id number]          PATIENT TYPE:  AMB   LOCATION:  SDC                           [facility]ILITY[facility]   PHYSICIAN:  Naima A. Dillard, M.D. DATE OF BIRTH:  [birth year]   DATE OF ADMISSION:  DATE OF DISCHARGE:                                HISTORY & PHYSICAL   CHIEF COMPLAINT:  Chronic pelvic pain, endometriosis.   HISTORY OF PRESENT ILLNESS:  The patient is a 45 year old [place] female who  has presented with several years of chronic pelvic pain and known  endometriosis.  The patient has had a laparoscopy back in 1989.  She has  been on Lupron, [place], and her pain did resolve for some time, but then  got consistently worse.  The patient denies any vaginal discharge, odor,  fever, irregular periods, or dyspareunia.  She does occasionally have  frequency and urgency.  No bleeding with urination.  No kidney stones or  constipation.  Occasionally has rectal bleeding and nausea and vomiting.  The patient is not pregnant, and does have a history of fibroids and  endometriosis.  The patient denies any history of sexually transmitted  diseases, PID, or an IUD.  Has not had an appendectomy or any gallbladder  disease.   PAST MEDICAL HISTORY:  As above.   PAST SURGICAL HISTORY:  Diagnostic laparoscopy in 1989.   ALLERGIES:  PENICILLIN gives her hives.   MEDICATIONS:  1.  Ortho-Cept.  2.  Multivitamins.  3.  Vitamin E.   SOCIAL HISTORY:  The patient does not smoke, drink, or do any illicit drug  use.   REVIEW OF SYSTEMS:  ENDOCRINE:  Unremarkable.  PSYCHIATRIC:  Unremarkable.  CARDIOVASCULAR/RESPIRATORY:  Unremarkable.  GENITOURINARY:  As above.  The  patient also has a history of bladder spasms.  MUSCULOSKELETAL:  Unremarkable.   PHYSICAL EXAMINATION:  VITAL SIGNS:  Blood pressure is 100/70, weight is 120  pounds.  HEENT:  Pupils equal.  Hearing is normal.  Throat is clear.  Thyroid is not   enlarged.  HEART:  Regular rate and rhythm.  CHEST:  Clear to auscultation bilaterally.  BREASTS:  No masses, discharge, skin change, or nipple retraction  bilaterally.  BACK:  No severe tenderness.  ABDOMEN:  Nontender without any masses or organomegaly.  EXTREMITIES:  No clubbing, cyanosis, or edema.  NEUROLOGIC:  Within normal limits.  PELVIC:  Full vaginal examination is within normal limits.  Cervix is  nontender without any lesions.  The patient does have mild right adnexal  tenderness.  No palpable lesions are found.   ASSESSMENT:  Endometriosis and pelvic pain.   The patient has decided to pursue a laparoscopy.  She understands that her  options are, but are not limited to, observation, medications such as  Lupron, continuous birth control pills, or just regular birth control pill  use, or laparoscopy.  The patient has decided to proceed with laparoscopy  and probably will do Lupron if endometriosis is found.  Her last Pap smear  in August 2006 was found to be within normal limits.  The  patient  understands the risks to be, but not limited to, bleeding, infection, damage  to internal organs such as bowel, bladder, or major blood vessels.      Naima A. Normand Sloop, M.D.  Electronically Signed     NAD/MEDQ  D:  06/01/2005  T:  06/01/2005  Job:  [id number]

## 2011-07-10 ENCOUNTER — Telehealth: Payer: Self-pay | Admitting: *Deleted

## 2011-07-10 NOTE — Telephone Encounter (Signed)
Pt is wanting her physical to be done, on Nov 23 or 24 due to her travel sch.

## 2011-07-13 NOTE — Telephone Encounter (Signed)
Left message for pt to call back to sch physical appointment

## 2011-07-13 NOTE — Telephone Encounter (Signed)
Set up appointment for Nov 23

## 2011-07-13 NOTE — Telephone Encounter (Signed)
Ok with me if there are available slots - takes 30 min

## 2011-07-15 ENCOUNTER — Other Ambulatory Visit: Payer: Self-pay | Admitting: Obstetrics and Gynecology

## 2011-07-15 DIAGNOSIS — Z1231 Encounter for screening mammogram for malignant neoplasm of breast: Secondary | ICD-10-CM

## 2011-08-14 ENCOUNTER — Ambulatory Visit
Admission: RE | Admit: 2011-08-14 | Discharge: 2011-08-14 | Disposition: A | Discharge status: Home / Self Care | Payer: BC Managed Care – PPO | Source: Ambulatory Visit | Attending: Obstetrics and Gynecology | Admitting: Obstetrics and Gynecology

## 2011-08-14 ENCOUNTER — Encounter: Payer: BC Managed Care – PPO | Admitting: Internal Medicine

## 2011-08-14 DIAGNOSIS — Z1231 Encounter for screening mammogram for malignant neoplasm of breast: Secondary | ICD-10-CM

## 2011-09-10 ENCOUNTER — Other Ambulatory Visit: Payer: Self-pay | Admitting: Internal Medicine

## 2011-09-10 DIAGNOSIS — Z0000 Encounter for general adult medical examination without abnormal findings: Secondary | ICD-10-CM

## 2011-09-14 ENCOUNTER — Other Ambulatory Visit (INDEPENDENT_AMBULATORY_CARE_PROVIDER_SITE_OTHER): Payer: Managed Care, Other (non HMO)

## 2011-09-14 ENCOUNTER — Other Ambulatory Visit: Payer: Self-pay | Admitting: Internal Medicine

## 2011-09-14 ENCOUNTER — Encounter: Payer: Self-pay | Admitting: Internal Medicine

## 2011-09-14 ENCOUNTER — Ambulatory Visit (INDEPENDENT_AMBULATORY_CARE_PROVIDER_SITE_OTHER): Payer: Managed Care, Other (non HMO) | Admitting: Internal Medicine

## 2011-09-14 DIAGNOSIS — R1031 Right lower quadrant pain: Secondary | ICD-10-CM

## 2011-09-14 DIAGNOSIS — N809 Endometriosis, unspecified: Secondary | ICD-10-CM

## 2011-09-14 DIAGNOSIS — Z0000 Encounter for general adult medical examination without abnormal findings: Secondary | ICD-10-CM

## 2011-09-14 DIAGNOSIS — Z8719 Personal history of other diseases of the digestive system: Secondary | ICD-10-CM

## 2011-09-14 LAB — HEPATIC FUNCTION PANEL
AST: 17 U/L (ref 0–37)
Albumin: 4.2 g/dL (ref 3.5–5.2)
Alkaline Phosphatase: 36 U/L — ABNORMAL LOW (ref 39–117)
Bilirubin, Direct: 0.1 mg/dL (ref 0.0–0.3)
Total Bilirubin: 0.6 mg/dL (ref 0.3–1.2)

## 2011-09-14 LAB — CBC WITH DIFFERENTIAL/PLATELET
Basophils Absolute: 0 10*3/uL (ref 0.0–0.1)
Basophils Relative: 0.8 % (ref 0.0–3.0)
Eosinophils Absolute: 0.2 10*3/uL (ref 0.0–0.7)
Hemoglobin: 13.5 g/dL (ref 12.0–15.0)
Lymphocytes Relative: 33.3 % (ref 12.0–46.0)
MCHC: 34.1 g/dL (ref 30.0–36.0)
Monocytes Relative: 8 % (ref 3.0–12.0)
Neutrophils Relative %: 55.4 % (ref 43.0–77.0)
RBC: 4.23 Mil/uL (ref 3.87–5.11)
RDW: 13.1 % (ref 11.5–14.6)

## 2011-09-14 LAB — BASIC METABOLIC PANEL
Calcium: 8.9 mg/dL (ref 8.4–10.5)
GFR: 80.4 mL/min (ref >60.00)
Glucose, Bld: 92 mg/dL (ref 70–99)
Potassium: 4.1 mEq/L (ref 3.5–5.1)
Sodium: 137 mEq/L (ref 135–145)

## 2011-09-14 LAB — LIPID PANEL
HDL: 76.5 mg/dL (ref >39.00)
Total CHOL/HDL Ratio: 3
VLDL: 12 mg/dL (ref 0.0–40.0)

## 2011-09-14 LAB — LDL CHOLESTEROL, DIRECT: Direct LDL: 122 mg/dL

## 2011-09-14 NOTE — Progress Notes (Signed)
Subjective:    Patient ID: [name] [name], female    DOB: [birth year], 45 y.o.   MRN: [id number]  HPI [name] [name] presents for annual wellness exam. She reports that she is doing well. No major illness, surgery or injury.  She does see a gynecologist - being s/p hysterectomy she needs q 2-3 year exams. She still has intermittent right lower quadrant abdominal pain but is better and tolerable. It did take almost a year to recover from surgery with effects of surgery and associated medications.   Past Medical History  Diagnosis Date  . Endometriosis, site unspecified   . Routine general medical examination at a health care facility   . Internal hemorrhoids without mention of complication   . Personal history of other diseases of digestive system   . Personal history of other diseases of digestive system   . 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 Insomnia, unspecified   . Lumbago   . Gastritis   . UTI (urinary tract infection)   . Vasovagal syncope   . Seizure, febrile   . History of mumps   . History of measles    Past Surgical History  Procedure Date  . Laparoscopy 2006    for endometriosis with scar tissue  . Robotic assisted lap vaginal hysterectomy 12/2008   Family History  Problem Relation Age of Onset  . Arthritis Mother   . Coronary artery disease Mother   . Hypertension Mother   . Diabetes Mother   . Esophageal cancer Father   . Schizophrenia Father   . Diabetes Father   . Allergic rhinitis Sister   . Diabetes Sister   . Hyperlipidemia Sister   . Breast cancer Maternal Aunt   . Colon cancer Paternal Aunt   . Asthma Brother    History   Social History  . Marital Status: Single    Spouse Name: N/A    Number of Children: N/A  . Years of Education: N/A   Occupational History  . Customer Service [employer]   Social History Main Topics  . Smoking status: Never Smoker   . Smokeless tobacco: Not on file  . Alcohol Use: Not on  file  . Drug Use: Not on file  . Sexually Active: Not on file   Other Topics Concern  . Not on file   Social History Narrative   One of 4 children with a twin brotherHSG, BA-pscyh/counselingSingle; shares a house w/sister. Extended family in [place] and [place]       Review of Systems Constitutional:  Negative for fever, chills, activity change and unexpected weight change.  HEENT:  Negative for hearing loss, ear pain, congestion, neck stiffness and postnasal drip. Negative for sore throat or swallowing problems. Negative for dental complaints.   Eyes: Negative for vision loss or change in visual acuity.  Respiratory: Negative for chest tightness and wheezing. Negative for DOE.   Cardiovascular: Negative for chest pain or palpitations. No decreased exercise tolerance Gastrointestinal: No change in bowel habit but perhaps incomplete emptying. No bloating or gas. No reflux or indigestion Genitourinary: Negative for urgency, frequency, flank pain and difficulty urinating.  Musculoskeletal: Negative for myalgias, back pain, arthralgias and gait problem.  Neurological: Negative for dizziness, tremors, weakness and headaches.  Hematological: Negative for adenopathy.  Psychiatric/Behavioral: Negative for behavioral problems and dysphoric mood.       Objective:   Physical Exam Vitals reviewed - normal Gen'l - slender woman in no distress HEENT - [place]/AT,  C&S clear, PERRLA, EACs/TMs normal, oropharynx w/o lesions, posterior pharynx clear Neck - supple, w/o thyromegaly Nodes - negative in the submandibular, cervical and supraclavicular regions Chest - no deformity Breast exam - deferred Pulm - normal respirations, normal breath sounds Cor - 2+ radial and DP pulses, RRR w/o murmur, rub or gallop Abd - flat, BS+, no guarding or rebound, no HSM Pelvic - deferred to gyn Ext - no deformity, all small, medium and large joints move well Derm - 1 cm diameter lesion left chin otherwise  clear Neuro - A&O x 3, CN II-XII normal, MS normal, gait and balance normal.  Lab Results  Component Value Date   WBC 6.2 09/14/2011   HGB 13.5 09/14/2011   HCT 39.6 09/14/2011   PLT 272.0 09/14/2011   GLUCOSE 92 09/14/2011   CHOL 223* 09/14/2011   TRIG 60.0 09/14/2011   HDL 76.50 09/14/2011   LDLDIRECT 122.0 09/14/2011   ALT 15 09/14/2011   AST 17 09/14/2011   NA 137 09/14/2011   K 4.1 09/14/2011   CL 102 09/14/2011   CREATININE 0.8 09/14/2011   BUN 12 09/14/2011   CO2 29 09/14/2011   TSH 5.07 09/14/2011          Assessment & Plan:

## 2011-09-16 DIAGNOSIS — Z0000 Encounter for general adult medical examination without abnormal findings: Secondary | ICD-10-CM | POA: Insufficient documentation

## 2011-09-16 NOTE — Assessment & Plan Note (Signed)
Interval medical history is benign. Physical exam is normal sans breast and pelvic. Lab results are in normal range except for mildly elevated total cholesterol with an excellent HDL and an LDL that is better than goal of 130 or less in a low risk patient. Routine labs should be repeated in 3 years. She is current with mammography. She will not need vulvo-vaginal exam this year. An every 3-5 year schedule is in line with ACOG recommendations. Last colonoscopy in EMR Dec '06 - several small polyps. Follow-up recommended in 5 years per that note. Immunizations - no Tdap on record and this is recommended.   In summary - a very nice woman who is now living/working in [place], [place] with the [employer]. She is in good health and follows a very sensible life-style. She is advised to return for a general physical exam in 2-3 years. She of course will return sooner as needed.

## 2011-09-16 NOTE — Assessment & Plan Note (Signed)
Patient reports occasional dyspepsia for which she will take PPI. At this time she is not taking PPI on a daily basis.

## 2011-09-16 NOTE — Assessment & Plan Note (Signed)
S/p hysterectomy and single oopherectomy. Stable and doing well with minimal discomfort

## 2011-09-16 NOTE — Assessment & Plan Note (Signed)
Much less pain after hysterectomy for endometriosis. She says the residual intermittent discomfort is mild and tolerable. No further work-up indicated.

## 2012-05-20 ENCOUNTER — Ambulatory Visit (INDEPENDENT_AMBULATORY_CARE_PROVIDER_SITE_OTHER): Payer: Managed Care, Other (non HMO) | Admitting: Obstetrics and Gynecology

## 2012-05-20 ENCOUNTER — Encounter: Payer: Self-pay | Admitting: Obstetrics and Gynecology

## 2012-05-20 ENCOUNTER — Other Ambulatory Visit: Payer: Self-pay | Admitting: Obstetrics and Gynecology

## 2012-05-20 ENCOUNTER — Ambulatory Visit (INDEPENDENT_AMBULATORY_CARE_PROVIDER_SITE_OTHER): Payer: Managed Care, Other (non HMO)

## 2012-05-20 VITALS — BP 100/62 | HR 78 | Resp 14 | Ht 64.0 in | Wt 104.0 lb

## 2012-05-20 DIAGNOSIS — R109 Unspecified abdominal pain: Secondary | ICD-10-CM

## 2012-05-20 DIAGNOSIS — Z01419 Encounter for gynecological examination (general) (routine) without abnormal findings: Secondary | ICD-10-CM

## 2012-05-20 LAB — POCT URINALYSIS DIPSTICK
Leukocytes, UA: NEGATIVE
Nitrite, UA: NEGATIVE
Protein, UA: NEGATIVE
pH, UA: 7

## 2012-05-20 MED ORDER — ESTRADIOL 0.5 MG PO TABS: 0.5000 mg | ORAL_TABLET | Freq: Every day | ORAL | Status: DC

## 2012-05-20 NOTE — Progress Notes (Signed)
The patient reports:she complains of right side abdominal pain.  Contraception:NONE  Last mammogram: November 2012 "WNL Last pap:  November  2010 "WNL"  GC/Chlamydia cultures offered: declined HIV/RPR/HbsAg offered:  declined HSV 1 and 2 glycoprotein offered: declined  Menstrual cycle regular and monthly: No: HYST Menstrual flow normal: No:   Urinary symptoms: urinary frequency Normal bowel movements: Yes Reports abuse at home: No:    Menopausal symptoms:hot flashes, insomnia   The patient is not taking hormone replacement therapy The patient  is taking a Calcium supplement. The patient participates in regular exercise: no. Post-menopausal bleeding:no  The patient is not sexually active. History of DVT/PE: No Family history of breast cancer: Yes Maternal.Aunt Family history of endometrial cancer:No  Subjective:     [name] [name] is a 46 y.o. female G0P0 who presents with complaints of menopausal symptoms and abdominal/pelvic pain.S/P robotic hysterectomy and LSO>  RLQ pain since 6 months with worsening. Requires NSAIDs at night. No radiation. Chronic back pain. Jabbing pain, on/off, with difficulty walking. Intensity up to 7/10. Lasting longer for days.   Associated symptoms: nausea, constipation is also worse during pain. Family history: non contributory .  Pertinent gyn history:   PMH: Past Medical History  Diagnosis Date  . Endometriosis, site unspecified   . Internal hemorrhoids without mention of complication   . Personal history of other diseases of digestive system   . 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 Insomnia, unspecified   . Lumbago   . Gastritis   . Vasovagal syncope   . Seizure, febrile   . History of mumps   . History of measles      Medication: (Not in a hospital admission) Allergies: Allergies  Allergen Reactions  . Penicillins       Review of systems:   Pertinent items are noted in  HPI.  Objective:    BP 100/62  Pulse 78  Resp 14  Ht 5\' 4"  (1.626 m)  Wt 104 lb (47.174 kg)  BMI 17.85 kg/m2  Weight: Wt Readings from Last 1 Encounters:  05/20/12 104 lb (47.174 kg)    BMI: Body mass index is 17.85 kg/(m^2).  General Appearance: Alert, appropriate appearance for age. No acute distress HEENT: Grossly normal Neck / Thyroid: Supple, no masses, nodes or enlargement Lungs: clear to auscultation bilaterally Back: No CVA tenderness Cardiovascular: Regular rate and rhythm. S1, S2, no murmur Gastrointestinal: Soft, non-tender, no masses or organomegaly. Diffuse tendernes in low pelvis Pelvic Exam: Vulva and vagina normal. Right adnexa exquisitely tender Rectovaginal: normal rectal, no masses  ULTRASOUND: Right Ovary: Length: 4.25 cm   Width:  2.45 cm   Height:  2.67 cm   Volume: 14.557 cm      Assessment and Plan:   Plelvic pain likely due to endometriosis and secondary pelvic floor dysfunction Pt desires to proceed with Depo-Lupron with add-back Follow-up: TBD  Dorthie Santini A  MD 8/30/201311:21 AM

## 2012-05-24 ENCOUNTER — Telehealth: Payer: Self-pay | Admitting: Obstetrics and Gynecology

## 2012-05-24 ENCOUNTER — Other Ambulatory Visit: Payer: Self-pay | Admitting: Obstetrics and Gynecology

## 2012-05-24 DIAGNOSIS — N949 Unspecified condition associated with female genital organs and menstrual cycle: Secondary | ICD-10-CM

## 2012-05-24 DIAGNOSIS — N809 Endometriosis, unspecified: Secondary | ICD-10-CM

## 2012-05-24 MED ORDER — LEUPROLIDE ACETATE (3 MONTH) 11.25 MG IM KIT: 11.2500 mg | PACK | INTRAMUSCULAR | Status: AC

## 2012-05-24 NOTE — Telephone Encounter (Signed)
TC to pt. LM to return call.  

## 2012-05-24 NOTE — Telephone Encounter (Signed)
Message copied by Mason Jim on Tue May 24, 2012  3:02 PM ------      Message from: Silverio Lay      Created: Fri May 20, 2012  2:20 PM      Regarding: Depo-Lupron       Depo-Lupron Orders            Dosage: 11.25 mg to give  As soon as received  Refills: 3            Diagnosis: CPP with endometriosis            UPT: no            Add-back hormonotherapy: yes already ordered for patient            Follow-up appointment: yes to schedule when 2nd injection is due            Surgery to follow treatment: no            Please send message back to Dr Estanislado Pandy when First dose given to patient            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 Please call her to plan where to send order

## 2012-05-24 NOTE — Progress Notes (Signed)
TC to [place] at [facility] [phone].  Lupron ordered via [employer].  Pt contacted. Made aware that pharmacy will contact her before Lupron is shipped regarding her copay. Pt states will not be in area until Oct. 4. Informed med should be available then. Pt to schedule appt but call ahead of time to make sure med has arrived.   Per Dr SR note, pt does not need UPT. Inj  to be given as soon as arrives.  Addback already ordered. Send Dr SR message when 1st does given.  Pt needs F/U appt at time 2nd injection  Is due.

## 2012-05-31 ENCOUNTER — Encounter: Payer: Self-pay | Admitting: Obstetrics and Gynecology

## 2012-05-31 NOTE — Progress Notes (Signed)
[employer] prior authorization form for Depo Lupron completed and faxed.

## 2012-06-15 ENCOUNTER — Telehealth: Payer: Self-pay | Admitting: Obstetrics and Gynecology

## 2012-06-15 NOTE — Telephone Encounter (Signed)
TC to pt.  Depo Lupron has not arrived at office. TC to pt. LM to return call.

## 2012-06-16 ENCOUNTER — Telehealth: Payer: Self-pay | Admitting: Obstetrics and Gynecology

## 2012-06-16 NOTE — Telephone Encounter (Signed)
TC from pt.  States pharmacy has called her about deductible and referred her to website to get information to reduce cost. Currently would be $300. Pt also states is reluctant to take any medication even though has had previously. Prefers to try exercises  Prescribed previously by PT. Will call if decides to proceed with Depo Lupron.

## 2012-07-01 ENCOUNTER — Telehealth: Payer: Self-pay | Admitting: Obstetrics and Gynecology

## 2012-07-01 NOTE — Telephone Encounter (Signed)
Returned pt's call. LM to return call.   

## 2012-07-01 NOTE — Telephone Encounter (Signed)
Please see pt msg 

## 2012-07-06 NOTE — Telephone Encounter (Signed)
TC from pt.  States Lupron has been approved and should arrive 07/06/12.   Will not be in town until week prior to Christmas.   Will call to schedule appt for lab for injection.

## 2012-07-12 ENCOUNTER — Other Ambulatory Visit: Payer: Self-pay | Admitting: Obstetrics and Gynecology

## 2012-07-12 DIAGNOSIS — Z1231 Encounter for screening mammogram for malignant neoplasm of breast: Secondary | ICD-10-CM

## 2012-08-01 ENCOUNTER — Telehealth: Payer: Self-pay

## 2012-08-04 NOTE — Telephone Encounter (Signed)
Note to close encounter. [name] [name], [name]  

## 2012-08-19 ENCOUNTER — Ambulatory Visit
Admission: RE | Admit: 2012-08-19 | Discharge: 2012-08-19 | Disposition: A | Discharge status: Home / Self Care | Payer: Managed Care, Other (non HMO) | Source: Ambulatory Visit | Attending: Obstetrics and Gynecology | Admitting: Obstetrics and Gynecology

## 2012-08-19 DIAGNOSIS — Z1231 Encounter for screening mammogram for malignant neoplasm of breast: Secondary | ICD-10-CM

## 2012-08-24 ENCOUNTER — Encounter: Payer: Self-pay | Admitting: Obstetrics and Gynecology

## 2012-08-24 NOTE — Progress Notes (Signed)
Quick Note:  Please send "Dense breast" letter to patient and document in chart when letter is sent. ______

## 2012-09-12 ENCOUNTER — Telehealth: Payer: Self-pay | Admitting: Obstetrics and Gynecology

## 2012-09-12 NOTE — Telephone Encounter (Signed)
Apt scheduled for depo-lupon.   Darien Ramus, CMA

## 2012-09-15 ENCOUNTER — Other Ambulatory Visit (INDEPENDENT_AMBULATORY_CARE_PROVIDER_SITE_OTHER): Payer: Managed Care, Other (non HMO)

## 2012-09-15 ENCOUNTER — Ambulatory Visit (INDEPENDENT_AMBULATORY_CARE_PROVIDER_SITE_OTHER): Payer: Managed Care, Other (non HMO) | Admitting: Internal Medicine

## 2012-09-15 ENCOUNTER — Encounter: Payer: Self-pay | Admitting: Internal Medicine

## 2012-09-15 VITALS — BP 102/58 | HR 85 | Temp 96.9°F | Resp 12 | Ht 64.0 in | Wt 104.0 lb

## 2012-09-15 DIAGNOSIS — R6889 Other general symptoms and signs: Secondary | ICD-10-CM

## 2012-09-15 DIAGNOSIS — N809 Endometriosis, unspecified: Secondary | ICD-10-CM

## 2012-09-15 DIAGNOSIS — Z0000 Encounter for general adult medical examination without abnormal findings: Secondary | ICD-10-CM

## 2012-09-15 DIAGNOSIS — R5383 Other fatigue: Secondary | ICD-10-CM

## 2012-09-15 DIAGNOSIS — Z8719 Personal history of other diseases of the digestive system: Secondary | ICD-10-CM

## 2012-09-15 DIAGNOSIS — R5381 Other malaise: Secondary | ICD-10-CM

## 2012-09-15 DIAGNOSIS — Z23 Encounter for immunization: Secondary | ICD-10-CM

## 2012-09-15 DIAGNOSIS — Z862 Personal history of diseases of the blood and blood-forming organs and certain disorders involving the immune mechanism: Secondary | ICD-10-CM

## 2012-09-15 DIAGNOSIS — R899 Unspecified abnormal finding in specimens from other organs, systems and tissues: Secondary | ICD-10-CM

## 2012-09-15 DIAGNOSIS — R55 Syncope and collapse: Secondary | ICD-10-CM

## 2012-09-15 DIAGNOSIS — R3915 Urgency of urination: Secondary | ICD-10-CM

## 2012-09-15 DIAGNOSIS — R531 Weakness: Secondary | ICD-10-CM

## 2012-09-15 LAB — URINALYSIS, ROUTINE W REFLEX MICROSCOPIC
Bilirubin Urine: NEGATIVE
Ketones, ur: NEGATIVE
Total Protein, Urine: NEGATIVE
Urine Glucose: NEGATIVE

## 2012-09-15 LAB — HEPATIC FUNCTION PANEL
ALT: 13 U/L (ref 0–35)
Bilirubin, Direct: 0 mg/dL (ref 0.0–0.3)
Total Bilirubin: 0.5 mg/dL (ref 0.3–1.2)

## 2012-09-15 LAB — CBC WITH DIFFERENTIAL/PLATELET
Basophils Relative: 0.8 % (ref 0.0–3.0)
Eosinophils Relative: 1.5 % (ref 0.0–5.0)
HCT: 41.5 % (ref 36.0–46.0)
Hemoglobin: 13.8 g/dL (ref 12.0–15.0)
Lymphs Abs: 2.2 10*3/uL (ref 0.7–4.0)
MCV: 92.3 fl (ref 78.0–100.0)
Monocytes Absolute: 0.6 10*3/uL (ref 0.1–1.0)
Neutro Abs: 5.9 10*3/uL (ref 1.4–7.7)
Platelets: 286 10*3/uL (ref 150.0–400.0)
RBC: 4.5 Mil/uL (ref 3.87–5.11)
WBC: 8.9 10*3/uL (ref 4.5–10.5)

## 2012-09-15 LAB — COMPREHENSIVE METABOLIC PANEL
Albumin: 4.2 g/dL (ref 3.5–5.2)
Alkaline Phosphatase: 34 U/L — ABNORMAL LOW (ref 39–117)
BUN: 14 mg/dL (ref 6–23)
Glucose, Bld: 95 mg/dL (ref 70–99)
Potassium: 4 mEq/L (ref 3.5–5.1)
Total Bilirubin: 0.5 mg/dL (ref 0.3–1.2)

## 2012-09-15 MED ORDER — LEUPROLIDE ACETATE (3 MONTH) 11.25 MG IM KIT
11.2500 mg | PACK | Freq: Once | INTRAMUSCULAR | Status: AC
  Administered 2012-09-15: 11.25 mg via INTRAMUSCULAR

## 2012-09-15 NOTE — Patient Instructions (Addendum)
Thanks for coming to see me. You as always are doing great. Check your blood sugar when you are feeling weak. If it is between 70-115 it is normal. If you continue to have weak spells and light-headedness we will need to check you blood pressure at those times.  Be well.  Happy New Year

## 2012-09-15 NOTE — Progress Notes (Signed)
Subjective:    Patient ID: [name] [name], female    DOB: 1966/03/11, 46 y.o.   MRN: [id number]  HPI The patient is here for annual wellness examination and management of other chronic and acute problems.  She has been feeling light-headedness and "weakish."  Question of low blood sugar or blood pressure. On-set is rather sudden.    The risk factors are reflected in the social history.  The roster of all physicians providing medical care to patient - is listed in the Snapshot section of the chart.  Activities of daily living:  The patient is 100% inedpendent in all ADLs: dressing, toileting, feeding as well as independent mobility  Home safety : The patient has smoke detectors in the home. They wear seatbelts. No firearms at home. There is no violence in the home.   There is no risks for hepatitis, STDs or HIV. There is no history of blood transfusion. They have no travel history to infectious disease endemic areas of the world.  The patient has seen their dentist in the last six month. They have seen their eye doctor in the last year. They deny any hearing difficulty and have not had audiologic testing in the last year.    They do not  have excessive sun exposure. Discussed the need for sun protection: hats, long sleeves and use of sunscreen if there is significant sun exposure.   Diet: the importance of a healthy diet is discussed. They do have a healthy diet.  The patient has no regular exercise program.  The benefits of regular aerobic exercise were discussed.  Depression screen: there are no signs or vegative symptoms of depression- irritability, change in appetite, anhedonia, sadness/tearfullness.  Cognitive assessment: the patient manages all their financial and personal affairs and is actively engaged.  Past Medical History  Diagnosis Date  . Endometriosis, site unspecified   . Internal hemorrhoids without mention of complication   . Personal history of other diseases of  digestive system   . 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 Insomnia, unspecified   . Lumbago   . Gastritis   . Vasovagal syncope   . Seizure, febrile   . History of mumps   . History of measles    Past Surgical History  Procedure Date  . Laparoscopy 2006    for endometriosis with scar tissue  . Robotic assisted lap vaginal hysterectomy 12/2008   Family History  Problem Relation Age of Onset  . Arthritis Mother   . Coronary artery disease Mother   . Hypertension Mother   . Diabetes Mother   . Esophageal cancer Father   . Schizophrenia Father   . Diabetes Father   . Allergic rhinitis Sister   . Diabetes Sister   . Hyperlipidemia Sister   . Breast cancer Maternal Aunt   . Colon cancer Paternal Aunt   . Asthma Brother    History   Social History  . Marital Status: Single    Spouse Name: N/A    Number of Children: N/A  . Years of Education: N/A   Occupational History  . Customer Service [employer]   Social History Main Topics  . Smoking status: Never Smoker   . Smokeless tobacco: Never Used  . Alcohol Use: No  . Drug Use: No  . Sexually Active: No   Other Topics Concern  . Not on file   Social History Narrative   One of 4 children with a twin brotherHSG, BA-pscyh/counselingSingle; shares  a house w/sister. Extended family in [place] and [place]   Current Outpatient Prescriptions on File Prior to Visit  Medication Sig Dispense Refill  . Ascorbic Acid (VITAMIN C) 500 MG tablet Take 500 mg by mouth daily.        . calcium carbonate (OS-CAL) 600 MG TABS Take 600 mg by mouth daily.        . Cholecalciferol (VITAMIN D3) 10000 UNITS capsule Take 10,000 Units by mouth daily.        . Multiple Vitamins-Minerals (MULTIVITAMIN,TX-MINERALS) tablet Take 1 tablet by mouth daily.        . pantoprazole (PROTONIX) 20 MG tablet Take 20 mg by mouth daily.      . vitamin E 400 UNIT capsule Take 400 Units by mouth daily.           Vision,  hearing, body mass index were assessed and reviewed.   During the course of the visit the patient was educated and counseled about appropriate screening and preventive services including : fall prevention , diabetes screening, nutrition counseling, colorectal cancer screening, and recommended immunizations.    Review of Systems Constitutional:  Negative for fever, chills, activity change and unexpected weight change.  HEENT:  Negative for hearing loss, ear pain, congestion, neck stiffness and postnasal drip. Negative for sore throat or swallowing problems. Negative for dental complaints.   Eyes: Negative for vision loss or change in visual acuity.  Respiratory: Negative for chest tightness and wheezing. Negative for DOE.   Cardiovascular: Negative for chest pain or palpitations. No decreased exercise tolerance Gastrointestinal: No change in bowel habit. No bloating or gas. No reflux or indigestion Genitourinary: Postive for urgency, frequency. No flank pain and difficulty urinating.  Musculoskeletal: Negative for myalgias, back pain, arthralgias and gait problem.  Neurological: Negative for dizziness, tremors, weakness and headaches.  Hematological: Negative for adenopathy.  Psychiatric/Behavioral: Negative for behavioral problems and dysphoric mood.       Objective:   Physical Exam Filed Vitals:   09/15/12 0854  BP: 102/58  Pulse: 85  Temp: 96.9 F (36.1 C)  Resp: 12   Wt Readings from Last 3 Encounters:  09/15/12 104 lb (47.174 kg)  05/20/12 104 lb (47.174 kg)  09/14/11 104 lb 12 oz (47.514 kg)   Gen'l: well nourished, well developed [place] Woman in no distress HEENT - [place]/AT, EACs/TMs normal, oropharynx with native dentition in good condition, no buccal or palatal lesions, posterior pharynx clear, mucous membranes moist. C&S clear, PERRLA, fundi - normal Neck - supple, no thyromegaly Nodes- negative submental, cervical, supraclavicular regions Chest - no deformity, no  CVAT Lungs - clear without rales, wheezes. No increased work of breathing Breast - deferred Cardiovascular - regular rate and rhythm, quiet precordium, no murmurs, rubs or gallops, 2+ radial, DP and PT pulses Abdomen - BS+ x 4, no HSM, no guarding or rebound or tenderness Pelvic - deferred to gyn Rectal - deferred to gyn Extremities - no clubbing, cyanosis, edema or deformity.  Neuro - A&O x 3, CN II-XII normal, motor strength normal and equal, DTRs 2+ and symmetrical biceps, radial, and patellar tendons. Cerebellar - no tremor, no rigidity, fluid movement and normal gait. Derm - Head, neck, back, abdomen and extremities without suspicious lesions  Lab Results  Component Value Date   WBC 8.9 09/15/2012   HGB 13.8 09/15/2012   HCT 41.5 09/15/2012   PLT 286.0 09/15/2012   GLUCOSE 95 09/15/2012   CHOL 223* 09/14/2011   TRIG 60.0 09/14/2011   HDL  76.50 09/14/2011   LDLDIRECT 122.0 09/14/2011        ALT 13 09/15/2012   AST 15 09/15/2012        NA 134* 09/15/2012   K 4.0 09/15/2012   CL 100 09/15/2012   CREATININE 0.6 09/15/2012   BUN 14 09/15/2012   CO2 28 09/15/2012   TSH 3.88 09/15/2012         Assessment & Plan:

## 2012-09-17 DIAGNOSIS — Z0000 Encounter for general adult medical examination without abnormal findings: Secondary | ICD-10-CM | POA: Insufficient documentation

## 2012-09-17 NOTE — Assessment & Plan Note (Signed)
Interval history is benign w/o major illness, surgery or injury. She has had colonoscopy w/ Dr. Loreta Ave Dec '06. She is current with Dr. Estanislado Pandy for Gyn and breast exam. Last mammogram Nv '13 - normal. Lab results are normal with an LDL - 122 which is below goal of 130 or less (NCEP ATP III). Immunization - Tdap is provided today.  In summary - a delightful woman who appears to be medically stable. She is encouraged to exercise on a regular basis and to consider gaining just a little weight. She will continue to monitor herself in regard to episodes of "light-headedness." She will return for a wellness exam in 18-24 months.

## 2012-09-17 NOTE — Assessment & Plan Note (Signed)
Lab Results  Component Value Date   HGB 13.8 09/15/2012   Robust hemoglobin - no hint of anemia

## 2012-09-17 NOTE — Assessment & Plan Note (Signed)
No report of any syncopal episodes but she does report episodes of feeling light-headed and weak.  Plan Provided glucometer with test strips - to check CBG at time of light-headed episode to r/o hypoglycemia  Regular meals with adequate protein  If possible check BP at times of episodes to r/o hypotension.

## 2013-02-08 ENCOUNTER — Encounter: Payer: Self-pay | Admitting: Internal Medicine

## 2013-07-27 ENCOUNTER — Other Ambulatory Visit: Payer: Self-pay

## 2013-07-31 ENCOUNTER — Other Ambulatory Visit: Payer: Self-pay

## 2013-07-31 DIAGNOSIS — Z1231 Encounter for screening mammogram for malignant neoplasm of breast: Secondary | ICD-10-CM

## 2013-09-15 ENCOUNTER — Ambulatory Visit
Admission: RE | Admit: 2013-09-15 | Discharge: 2013-09-15 | Disposition: A | Discharge status: Home / Self Care | Payer: Managed Care, Other (non HMO) | Source: Ambulatory Visit

## 2013-09-15 DIAGNOSIS — Z1231 Encounter for screening mammogram for malignant neoplasm of breast: Secondary | ICD-10-CM

## 2013-09-18 ENCOUNTER — Ambulatory Visit (INDEPENDENT_AMBULATORY_CARE_PROVIDER_SITE_OTHER): Payer: Managed Care, Other (non HMO) | Admitting: Internal Medicine

## 2013-09-18 ENCOUNTER — Encounter: Payer: Self-pay | Admitting: Internal Medicine

## 2013-09-18 ENCOUNTER — Other Ambulatory Visit (INDEPENDENT_AMBULATORY_CARE_PROVIDER_SITE_OTHER): Payer: Managed Care, Other (non HMO)

## 2013-09-18 VITALS — BP 96/70 | HR 94 | Temp 97.1°F | Ht 64.0 in | Wt 102.8 lb

## 2013-09-18 DIAGNOSIS — Z862 Personal history of diseases of the blood and blood-forming organs and certain disorders involving the immune mechanism: Secondary | ICD-10-CM

## 2013-09-18 DIAGNOSIS — Z0000 Encounter for general adult medical examination without abnormal findings: Secondary | ICD-10-CM

## 2013-09-18 LAB — BASIC METABOLIC PANEL
BUN: 12 mg/dL (ref 6–23)
Creatinine, Ser: 0.8 mg/dL (ref 0.4–1.2)
GFR: 84.41 mL/min (ref >60.00)
Glucose, Bld: 91 mg/dL (ref 70–99)
Potassium: 3.8 mEq/L (ref 3.5–5.1)

## 2013-09-18 LAB — LIPID PANEL
Cholesterol: 215 mg/dL — ABNORMAL HIGH (ref 0–200)
HDL: 63.7 mg/dL (ref >39.00)
Total CHOL/HDL Ratio: 3
Triglycerides: 111 mg/dL (ref 0.0–149.0)
VLDL: 22.2 mg/dL (ref 0.0–40.0)

## 2013-09-18 LAB — HEMOGLOBIN AND HEMATOCRIT, BLOOD: HCT: 40.3 % (ref 36.0–46.0)

## 2013-09-18 LAB — LDL CHOLESTEROL, DIRECT: Direct LDL: 144.4 mg/dL

## 2013-09-18 NOTE — Progress Notes (Signed)
Pre visit review using our clinic review tool, if applicable. No additional management support is needed unless otherwise documented below in the visit note. 

## 2013-09-18 NOTE — Patient Instructions (Signed)
It is good to see you and I am glad you have had a good year. You are current and up to date on your health maintenance  Lab today: cholesterol, basic metabolic panel and hemoglobin.  Your immunizations are current. The CDC recommendation is for MMR booster at age 46.  Have a Happy New Year.

## 2013-09-18 NOTE — Progress Notes (Signed)
Subjective:    Patient ID: [name] [name], female    DOB: December 22, 1965, 47 y.o.   MRN: [id number]  HPI Mx. Baeten presents for a routine medical wellness exam. She has had a good year - no major illness, no surgery and no injury. She is current with Gyn wth an exam in October. Colonoscopy in May - Dr. Loreta Ave - normal study. She has seen ophthalmologist and dentist. She has had flu shot in October, Tdap in 2013.   Past Medical History  Diagnosis Date  . Endometriosis, site unspecified   . Internal hemorrhoids without mention of complication   . Personal history of other diseases of digestive system   . 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 Insomnia, unspecified   . Lumbago   . Gastritis   . Vasovagal syncope   . Seizure, febrile   . History of mumps   . History of measles    Past Surgical History  Procedure Laterality Date  . Laparoscopy  2006    for endometriosis with scar tissue  . Robotic assisted lap vaginal hysterectomy  12/2008   Family History  Problem Relation Age of Onset  . Arthritis Mother   . Coronary artery disease Mother   . Hypertension Mother   . Diabetes Mother   . Esophageal cancer Father   . Schizophrenia Father   . Diabetes Father   . Allergic rhinitis Sister   . Diabetes Sister   . Hyperlipidemia Sister   . Breast cancer Maternal Aunt   . Colon cancer Paternal Aunt   . Asthma Brother    History   Social History  . Marital Status: Single    Spouse Name: N/A    Number of Children: 0  . Years of Education: 16   Occupational History  . Customer Service [employer]  .  [employer]   Social History Main Topics  . Smoking status: Never Smoker   . Smokeless tobacco: Never Used  . Alcohol Use: No  . Drug Use: No  . Sexual Activity: No   Other Topics Concern  . Not on file   Social History Narrative   One of 4 children with a twin brother. HSG, BA-pscyh/counseling. Single; shares a house w/sister. Mother is here  for an extended visit (Dec '13). 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  Extended family in [place] and [place]             Current Outpatient Prescriptions on File Prior to Visit  Medication Sig Dispense Refill  . Ascorbic Acid (VITAMIN C) 500 MG tablet Take 500 mg by mouth daily.        . calcium carbonate (OS-CAL) 600 MG TABS Take 600 mg by mouth daily.        . Cholecalciferol (VITAMIN D3) 10000 UNITS capsule Take 10,000 Units by mouth daily.        Marland Kitchen LUPRON DEPOT 11.25 MG injection       . Multiple Vitamins-Minerals (MULTIVITAMIN,TX-MINERALS) tablet Take 1 tablet by mouth daily.        . pantoprazole (PROTONIX) 20 MG tablet Take 20 mg by mouth daily.      . vitamin E 400 UNIT capsule Take 400 Units by mouth daily.         No current facility-administered medications on file prior to visit.      Review of Systems Constitutional:  Negative for fever, chills, activity change and unexpected weight change.  HEENT:  Negative  for hearing loss, ear pain, congestion, neck stiffness and postnasal drip. Negative for sore throat or swallowing problems. Negative for dental complaints.   Eyes: Negative for vision loss or change in visual acuity.  Respiratory: Negative for chest tightness and wheezing. Negative for DOE.   Cardiovascular: Negative for chest pain or palpitations. No decreased exercise tolerance Gastrointestinal: No change in bowel habit. No bloating or gas. No reflux or indigestion. Persistent RLQ pain. Genitourinary: Negative for urgency, frequency, flank pain and difficulty urinating.  Musculoskeletal: Negative for myalgias, back pain, arthralgias and gait problem.  Neurological: Negative for dizziness, tremors, weakness and headaches.  Hematological: Negative for adenopathy.  Psychiatric/Behavioral: Negative for behavioral problems and dysphoric mood.       Objective:   Physical Exam Filed Vitals:   09/18/13 1004  BP: 96/70  Pulse: 94  Temp: 97.1  F (36.2 C)   Gen'l- Slender woman who appears to be healthy HEENT- [place]/AT, Oropharynx w/o lesions, dentition in good repair., EAC/sTMs normal Cor- 2+ radial, RRR, no JVD, no Carotid bruits PUlm - CTAP Breast exam - deferred to gyn and mammography Abd - scaphoid, BS+, no HSM Pelvic - deferred to Gyn Ext - no deformity, no small, medium and large joints. Neuro - non focal exam.        Assessment & Plan:

## 2013-09-19 NOTE — Assessment & Plan Note (Signed)
Lab Results  Component Value Date   HGB 13.7 09/18/2013   No active problem

## 2013-09-19 NOTE — Assessment & Plan Note (Signed)
Interval history is benign. Physical exam sans breast and pelvic is normal. Lab reviewed - OK. She is current with Gyn, breast and colorectal cancer screening. Immunization - up to date.  In summar A very nice woman who is medically stable. She will return for general exam in 1-2 years.

## 2013-09-21 ENCOUNTER — Encounter: Payer: Self-pay | Admitting: Internal Medicine

## 2014-03-30 ENCOUNTER — Encounter: Payer: Self-pay | Admitting: Internal Medicine

## 2014-03-30 ENCOUNTER — Ambulatory Visit (INDEPENDENT_AMBULATORY_CARE_PROVIDER_SITE_OTHER): Payer: Managed Care, Other (non HMO) | Admitting: Internal Medicine

## 2014-03-30 VITALS — BP 94/64 | HR 76 | Temp 98.7°F | Resp 16 | Wt 104.0 lb

## 2014-03-30 DIAGNOSIS — N3090 Cystitis, unspecified without hematuria: Secondary | ICD-10-CM | POA: Insufficient documentation

## 2014-03-30 LAB — POCT URINALYSIS DIPSTICK
BILIRUBIN UA: NEGATIVE
Blood, UA: NEGATIVE
Glucose, UA: NEGATIVE
KETONES UA: NEGATIVE
Nitrite, UA: NEGATIVE
PROTEIN UA: NEGATIVE
Spec Grav, UA: <=1.005
Urobilinogen, UA: 0.2
pH, UA: 7.5

## 2014-03-30 MED ORDER — CIPROFLOXACIN HCL 250 MG PO TABS: 250.0000 mg | ORAL_TABLET | Freq: Two times a day (BID) | ORAL | Status: DC

## 2014-03-30 NOTE — Progress Notes (Signed)
   Subjective:    Patient ID: [name] [name], female    DOB: [birth year], 48 y.o.   MRN: [id number]  Dysuria  This is a new problem. The current episode started in the past 7 days. The problem occurs intermittently. The problem has been waxing and waning. The quality of the pain is described as burning. The pain is mild. There has been no fever. There is no history of pyelonephritis. Associated symptoms include frequency and urgency. Pertinent negatives include no discharge, flank pain or nausea. There is no history of kidney stones.      Review of Systems  Gastrointestinal: Negative for nausea.  Genitourinary: Positive for dysuria, urgency and frequency. Negative for flank pain.       Objective:   Physical Exam  Constitutional: She appears well-developed. No distress.  HENT:  Head: Normocephalic.  Right Ear: External ear normal.  Left Ear: External ear normal.  Nose: Nose normal.  Mouth/Throat: Oropharynx is clear and moist.  Eyes: Conjunctivae are normal. Pupils are equal, round, and reactive to light. Right eye exhibits no discharge. Left eye exhibits no discharge.  Neck: Normal range of motion. Neck supple. No JVD present. No tracheal deviation present. No thyromegaly present.  Cardiovascular: Normal rate, regular rhythm and normal heart sounds.   Pulmonary/Chest: No stridor. No respiratory distress. She has no wheezes.  Abdominal: Soft. Bowel sounds are normal. She exhibits no distension and no mass. There is tenderness (sensitive suprapubically). There is no rebound and no guarding.  Musculoskeletal: She exhibits no edema and no tenderness.  Lymphadenopathy:    She has no cervical adenopathy.  Neurological: She displays normal reflexes. No cranial nerve deficit. She exhibits normal muscle tone. Coordination normal.  Skin: No rash noted. No erythema.  Psychiatric: She has a normal mood and affect. Her behavior is normal. Judgment and thought content normal.    Urine dip leuk  (+)      Assessment & Plan:

## 2014-03-30 NOTE — Progress Notes (Signed)
Pre visit review using our clinic review tool, if applicable. No additional management support is needed unless otherwise documented below in the visit note. 

## 2014-03-30 NOTE — Assessment & Plan Note (Signed)
UA Cipro 

## 2014-06-19 ENCOUNTER — Encounter: Payer: Self-pay | Admitting: Internal Medicine

## 2014-06-22 ENCOUNTER — Ambulatory Visit: Payer: Managed Care, Other (non HMO) | Admitting: Internal Medicine

## 2014-06-29 ENCOUNTER — Ambulatory Visit: Payer: Managed Care, Other (non HMO) | Admitting: Internal Medicine

## 2014-08-02 ENCOUNTER — Other Ambulatory Visit: Payer: Self-pay

## 2014-08-02 DIAGNOSIS — Z1231 Encounter for screening mammogram for malignant neoplasm of breast: Secondary | ICD-10-CM

## 2014-09-17 ENCOUNTER — Ambulatory Visit
Admission: RE | Admit: 2014-09-17 | Discharge: 2014-09-17 | Disposition: A | Discharge status: Home / Self Care | Payer: Managed Care, Other (non HMO) | Source: Ambulatory Visit

## 2014-09-17 DIAGNOSIS — Z1231 Encounter for screening mammogram for malignant neoplasm of breast: Secondary | ICD-10-CM

## 2014-09-18 ENCOUNTER — Other Ambulatory Visit: Payer: Self-pay | Admitting: Obstetrics and Gynecology

## 2014-09-18 DIAGNOSIS — R928 Other abnormal and inconclusive findings on diagnostic imaging of breast: Secondary | ICD-10-CM

## 2014-09-19 ENCOUNTER — Other Ambulatory Visit: Payer: Managed Care, Other (non HMO)

## 2014-09-19 ENCOUNTER — Other Ambulatory Visit (INDEPENDENT_AMBULATORY_CARE_PROVIDER_SITE_OTHER): Payer: Managed Care, Other (non HMO)

## 2014-09-19 ENCOUNTER — Ambulatory Visit (INDEPENDENT_AMBULATORY_CARE_PROVIDER_SITE_OTHER): Payer: Managed Care, Other (non HMO) | Admitting: Internal Medicine

## 2014-09-19 ENCOUNTER — Encounter: Payer: Self-pay | Admitting: Internal Medicine

## 2014-09-19 VITALS — BP 110/60 | HR 78 | Temp 97.8°F | Resp 14 | Ht 64.0 in | Wt 103.0 lb

## 2014-09-19 DIAGNOSIS — R350 Frequency of micturition: Secondary | ICD-10-CM

## 2014-09-19 DIAGNOSIS — IMO0001 Reserved for inherently not codable concepts without codable children: Secondary | ICD-10-CM

## 2014-09-19 DIAGNOSIS — Z0000 Encounter for general adult medical examination without abnormal findings: Secondary | ICD-10-CM

## 2014-09-19 DIAGNOSIS — N3090 Cystitis, unspecified without hematuria: Secondary | ICD-10-CM

## 2014-09-19 LAB — LIPID PANEL
CHOL/HDL RATIO: 3
Cholesterol: 239 mg/dL — ABNORMAL HIGH (ref 0–200)
HDL: 69.2 mg/dL (ref >39.00)
LDL Cholesterol: 153 mg/dL — ABNORMAL HIGH (ref 0–99)
NONHDL: 169.8
Triglycerides: 82 mg/dL (ref 0.0–149.0)
VLDL: 16.4 mg/dL (ref 0.0–40.0)

## 2014-09-19 LAB — URINALYSIS, ROUTINE W REFLEX MICROSCOPIC
Bilirubin Urine: NEGATIVE
KETONES UR: NEGATIVE
Leukocytes, UA: NEGATIVE
Nitrite: NEGATIVE
PH: 6.5 (ref 5.0–8.0)
Specific Gravity, Urine: <=1.005 — AB (ref 1.000–1.030)
TOTAL PROTEIN, URINE-UPE24: NEGATIVE
Urine Glucose: NEGATIVE
Urobilinogen, UA: 0.2 (ref 0.0–1.0)

## 2014-09-19 LAB — BASIC METABOLIC PANEL
BUN: 14 mg/dL (ref 6–23)
CO2: 28 meq/L (ref 19–32)
Calcium: 8.9 mg/dL (ref 8.4–10.5)
Chloride: 104 mEq/L (ref 96–112)
Creatinine, Ser: 0.8 mg/dL (ref 0.4–1.2)
GFR: 87.94 mL/min (ref >60.00)
Glucose, Bld: 90 mg/dL (ref 70–99)
POTASSIUM: 4 meq/L (ref 3.5–5.1)
SODIUM: 138 meq/L (ref 135–145)

## 2014-09-19 LAB — HEMOGLOBIN A1C: Hgb A1c MFr Bld: 5.8 % (ref 4.6–6.5)

## 2014-09-19 NOTE — Assessment & Plan Note (Signed)
Up to date at this time. Check lipid, HgA1c, BMP.

## 2014-09-19 NOTE — Patient Instructions (Signed)
We will check you for any signs of diabetes as well as your usual blood work.  We will check your urine and call you back today if there are any signs of infection.   We will see you back next year for your physical and please feel free to call us if you have any problems sooner.

## 2014-09-19 NOTE — Progress Notes (Signed)
Pre visit review using our clinic review tool, if applicable. No additional management support is needed unless otherwise documented below in the visit note. 

## 2014-09-19 NOTE — Progress Notes (Signed)
   Subjective:    Patient ID: [name] [name], female    DOB: 04/02/1966, 48 y.o.   MRN: [id number]  HPI The patient is a 48 YO female who comes in for annual physical. She is not having any new problems. She does have occasional GERD for which she takes protonix based on what foods she is eating. She did play sports in college and has some occasional knee pains with walking at certain angles but does not need to take anything over the counter as the pain resolves. She does have some frequency right now which she experienced in July as well and was treated with ciprofloxacin.   Review of Systems  Constitutional: Negative for fever, activity change, appetite change, fatigue and unexpected weight change.  HENT: Negative.   Respiratory: Negative for cough, chest tightness, shortness of breath and wheezing.   Cardiovascular: Negative for chest pain, palpitations and leg swelling.  Gastrointestinal: Negative for nausea, abdominal pain, diarrhea, constipation and abdominal distention.  Genitourinary: Positive for urgency and frequency. Negative for dysuria, hematuria, flank pain and difficulty urinating.  Musculoskeletal: Positive for arthralgias.       Left knee pain  Skin: Negative.   Neurological: Negative.       Objective:   Physical Exam  Constitutional: She is oriented to person, place, and time. She appears well-developed and well-nourished.  HENT:  Head: Normocephalic and atraumatic.  Eyes: EOM are normal.  Neck: Normal range of motion.  Cardiovascular: Normal rate and regular rhythm.   No murmur heard. Pulmonary/Chest: Effort normal and breath sounds normal. No respiratory distress. She has no wheezes. She has no rales.  Abdominal: Soft. Bowel sounds are normal. She exhibits no distension. There is no tenderness. There is no rebound.  Musculoskeletal: Normal range of motion.  Neurological: She is alert and oriented to person, place, and time. Coordination normal.  Skin: Skin is  warm and dry.   Filed Vitals:   09/19/14 0818  BP: 110/60  Pulse: 78  Temp: 97.8 F (36.6 C)  TempSrc: Oral  Resp: 14  Height: 5\' 4"  (1.626 m)  Weight: 103 lb (46.72 kg)  SpO2: 98%      Assessment & Plan:

## 2014-09-19 NOTE — Assessment & Plan Note (Signed)
U/A culture. Will call in antibiotic if needed.

## 2014-09-20 LAB — URINE CULTURE: Colony Count: 80000

## 2014-09-24 ENCOUNTER — Ambulatory Visit
Admission: RE | Admit: 2014-09-24 | Discharge: 2014-09-24 | Disposition: A | Discharge status: Home / Self Care | Payer: Managed Care, Other (non HMO) | Source: Ambulatory Visit | Attending: Obstetrics and Gynecology | Admitting: Obstetrics and Gynecology

## 2014-09-24 ENCOUNTER — Other Ambulatory Visit: Payer: Self-pay | Admitting: Internal Medicine

## 2014-09-24 ENCOUNTER — Encounter: Payer: Self-pay | Admitting: Internal Medicine

## 2014-09-24 DIAGNOSIS — R928 Other abnormal and inconclusive findings on diagnostic imaging of breast: Secondary | ICD-10-CM

## 2014-09-24 MED ORDER — NITROFURANTOIN MONOHYD MACRO 100 MG PO CAPS: 100.0000 mg | ORAL_CAPSULE | Freq: Two times a day (BID) | ORAL | Status: DC

## 2015-03-27 ENCOUNTER — Other Ambulatory Visit: Payer: Self-pay | Admitting: General Surgery

## 2015-03-27 ENCOUNTER — Inpatient Hospital Stay
Admission: RE | Admit: 2015-03-27 | Discharge: 2015-03-27 | Disposition: A | Discharge status: Home / Self Care | Payer: Self-pay | Source: Ambulatory Visit | Attending: General Surgery | Admitting: General Surgery

## 2015-03-27 ENCOUNTER — Other Ambulatory Visit: Payer: Self-pay

## 2015-03-27 DIAGNOSIS — R52 Pain, unspecified: Secondary | ICD-10-CM

## 2015-03-27 DIAGNOSIS — R922 Inconclusive mammogram: Secondary | ICD-10-CM

## 2015-03-27 DIAGNOSIS — N6310 Unspecified lump in the right breast, unspecified quadrant: Secondary | ICD-10-CM

## 2015-03-31 ENCOUNTER — Ambulatory Visit
Admission: RE | Admit: 2015-03-31 | Discharge: 2015-03-31 | Disposition: A | Discharge status: Home / Self Care | Payer: Managed Care, Other (non HMO) | Source: Ambulatory Visit | Attending: General Surgery | Admitting: General Surgery

## 2015-03-31 MED ORDER — GADOBENATE DIMEGLUMINE 529 MG/ML IV SOLN
9.0000 mL | Freq: Once | INTRAVENOUS | Status: AC | PRN
  Administered 2015-03-31: 9 mL via INTRAVENOUS

## 2015-04-01 ENCOUNTER — Other Ambulatory Visit: Payer: Managed Care, Other (non HMO)

## 2015-06-28 ENCOUNTER — Other Ambulatory Visit: Payer: Managed Care, Other (non HMO)

## 2015-09-27 ENCOUNTER — Encounter: Payer: Self-pay | Admitting: Internal Medicine

## 2015-09-27 ENCOUNTER — Ambulatory Visit (INDEPENDENT_AMBULATORY_CARE_PROVIDER_SITE_OTHER): Payer: Managed Care, Other (non HMO) | Admitting: Internal Medicine

## 2015-09-27 ENCOUNTER — Other Ambulatory Visit (INDEPENDENT_AMBULATORY_CARE_PROVIDER_SITE_OTHER): Payer: Managed Care, Other (non HMO)

## 2015-09-27 VITALS — BP 104/64 | HR 71 | Temp 98.1°F | Ht 64.0 in | Wt 106.2 lb

## 2015-09-27 DIAGNOSIS — Z0000 Encounter for general adult medical examination without abnormal findings: Secondary | ICD-10-CM

## 2015-09-27 LAB — LIPID PANEL
CHOL/HDL RATIO: 3
Cholesterol: 253 mg/dL — ABNORMAL HIGH (ref 0–200)
HDL: 78.4 mg/dL (ref >39.00)
LDL CALC: 158 mg/dL — AB (ref 0–99)
NONHDL: 174.76
Triglycerides: 83 mg/dL (ref 0.0–149.0)
VLDL: 16.6 mg/dL (ref 0.0–40.0)

## 2015-09-27 LAB — COMPREHENSIVE METABOLIC PANEL
ALT: 12 U/L (ref 0–35)
AST: 16 U/L (ref 0–37)
Albumin: 4.7 g/dL (ref 3.5–5.2)
Alkaline Phosphatase: 38 U/L — ABNORMAL LOW (ref 39–117)
BUN: 14 mg/dL (ref 6–23)
CHLORIDE: 101 meq/L (ref 96–112)
CO2: 27 meq/L (ref 19–32)
CREATININE: 0.76 mg/dL (ref 0.40–1.20)
Calcium: 9.6 mg/dL (ref 8.4–10.5)
GFR: 86.23 mL/min (ref >60.00)
GLUCOSE: 95 mg/dL (ref 70–99)
Potassium: 3.7 mEq/L (ref 3.5–5.1)
SODIUM: 138 meq/L (ref 135–145)
Total Bilirubin: 0.5 mg/dL (ref 0.2–1.2)
Total Protein: 8 g/dL (ref 6.0–8.3)

## 2015-09-27 LAB — CBC
HCT: 43.1 % (ref 36.0–46.0)
HEMOGLOBIN: 14.3 g/dL (ref 12.0–15.0)
MCHC: 33.2 g/dL (ref 30.0–36.0)
MCV: 90.9 fl (ref 78.0–100.0)
Platelets: 310 10*3/uL (ref 150.0–400.0)
RBC: 4.74 Mil/uL (ref 3.87–5.11)
RDW: 13.4 % (ref 11.5–15.5)
WBC: 6.3 10*3/uL (ref 4.0–10.5)

## 2015-09-27 LAB — HEMOGLOBIN A1C: Hgb A1c MFr Bld: 5.6 % (ref 4.6–6.5)

## 2015-09-27 NOTE — Assessment & Plan Note (Signed)
Colonoscopy and mammogram up to date. Checking labs today and adjust as needed. Non-smoker and BP normal. Not on meds. Recommend bone density in 2020.

## 2015-09-27 NOTE — Progress Notes (Signed)
Pre visit review using our clinic review tool, if applicable. No additional management support is needed unless otherwise documented below in the visit note. 

## 2015-09-27 NOTE — Progress Notes (Signed)
   Subjective:    Patient ID: [name] [name], female    DOB: [birth year], 50 y.o.   MRN: [id number]  HPI The patient is a 50 YO female coming in for wellness. No new concerns. Please see A/P for status and treatment of any chronic medical problems.   [facility], [facility], social history reviewed and updated.   Review of Systems  Constitutional: Negative for fever, activity change, appetite change, fatigue and unexpected weight change.  HENT: Negative.   Respiratory: Negative for cough, chest tightness, shortness of breath and wheezing.   Cardiovascular: Negative for chest pain, palpitations and leg swelling.  Gastrointestinal: Negative for nausea, abdominal pain, diarrhea, constipation and abdominal distention.  Genitourinary: Negative for dysuria, urgency, frequency, hematuria, flank pain and difficulty urinating.  Musculoskeletal: Negative for myalgias, back pain and arthralgias.       Left knee pain  Skin: Negative.   Neurological: Negative.   Psychiatric/Behavioral: Negative.       Objective:   Physical Exam  Constitutional: She is oriented to person, place, and time. She appears well-developed and well-nourished.  HENT:  Head: Normocephalic and atraumatic.  Eyes: EOM are normal.  Neck: Normal range of motion.  Cardiovascular: Normal rate and regular rhythm.   No murmur heard. Pulmonary/Chest: Effort normal and breath sounds normal. No respiratory distress. She has no wheezes. She has no rales.  Abdominal: Soft. Bowel sounds are normal. She exhibits no distension. There is no tenderness. There is no rebound.  Musculoskeletal: Normal range of motion.  Neurological: She is alert and oriented to person, place, and time. Coordination normal.  Skin: Skin is warm and dry.   Filed Vitals:   09/27/15 0804  BP: 104/64  Pulse: 71  Temp: 98.1 F (36.7 C)  TempSrc: Oral  Height: 5\' 4"  (1.626 m)  Weight: 106 lb 4 oz (48.195 kg)  SpO2: 97%      Assessment & Plan:

## 2015-09-27 NOTE — Patient Instructions (Signed)
We will check the lab work today and send you the results on mychart.   Health Maintenance, Female Adopting a healthy lifestyle and getting preventive care can go a long way to promote health and wellness. Talk with your health care provider about what schedule of regular examinations is right for you. This is a good chance for you to check in with your provider about disease prevention and staying healthy. In between checkups, there are plenty of things you can do on your own. Experts have done a lot of research about which lifestyle changes and preventive measures are most likely to keep you healthy. Ask your health care provider for more information. WEIGHT AND DIET  Eat a healthy diet  Be sure to include plenty of vegetables, fruits, low-fat dairy products, and lean protein.  Do not eat a lot of foods high in solid fats, added sugars, or salt.  Get regular exercise. This is one of the most important things you can do for your health.  Most adults should exercise for at least 150 minutes each week. The exercise should increase your heart rate and make you sweat (moderate-intensity exercise).  Most adults should also do strengthening exercises at least twice a week. This is in addition to the moderate-intensity exercise.  Maintain a healthy weight  Body mass index (BMI) is a measurement that can be used to identify possible weight problems. It estimates body fat based on height and weight. Your health care provider can help determine your BMI and help you achieve or maintain a healthy weight.  For females 48 years of age and older:   A BMI below 18.5 is considered underweight.  A BMI of 18.5 to 24.9 is normal.  A BMI of 25 to 29.9 is considered overweight.  A BMI of 30 and above is considered obese.  Watch levels of cholesterol and blood lipids  You should start having your blood tested for lipids and cholesterol at 49 years of age, then have this test every 5 years.  You may  need to have your cholesterol levels checked more often if:  Your lipid or cholesterol levels are high.  You are older than 49 years of age.  You are at high risk for heart disease.  CANCER SCREENING   Lung Cancer  Lung cancer screening is recommended for adults 38-8 years old who are at high risk for lung cancer because of a history of smoking.  A yearly low-dose CT scan of the lungs is recommended for people who:  Currently smoke.  Have quit within the past 15 years.  Have at least a 30-pack-year history of smoking. A pack year is smoking an average of one pack of cigarettes a day for 1 year.  Yearly screening should continue until it has been 15 years since you quit.  Yearly screening should stop if you develop a health problem that would prevent you from having lung cancer treatment.  Breast Cancer  Practice breast self-awareness. This means understanding how your breasts normally appear and feel.  It also means doing regular breast self-exams. Let your health care provider know about any changes, no matter how small.  If you are in your 20s or 30s, you should have a clinical breast exam (CBE) by a health care provider every 1-3 years as part of a regular health exam.  If you are 11 or older, have a CBE every year. Also consider having a breast X-ray (mammogram) every year.  If you have a family  history of breast cancer, talk to your health care provider about genetic screening.  If you are at high risk for breast cancer, talk to your health care provider about having an MRI and a mammogram every year.  Breast cancer gene (BRCA) assessment is recommended for women who have family members with BRCA-related cancers. BRCA-related cancers include:  Breast.  Ovarian.  Tubal.  Peritoneal cancers.  Results of the assessment will determine the need for genetic counseling and BRCA1 and BRCA2 testing. Cervical Cancer Your health care provider may recommend that you be  screened regularly for cancer of the pelvic organs (ovaries, uterus, and vagina). This screening involves a pelvic examination, including checking for microscopic changes to the surface of your cervix (Pap test). You may be encouraged to have this screening done every 3 years, beginning at age 38.  For women ages 83-65, health care providers may recommend pelvic exams and Pap testing every 3 years, or they may recommend the Pap and pelvic exam, combined with testing for human papilloma virus (HPV), every 5 years. Some types of HPV increase your risk of cervical cancer. Testing for HPV may also be done on women of any age with unclear Pap test results.  Other health care providers may not recommend any screening for nonpregnant women who are considered low risk for pelvic cancer and who do not have symptoms. Ask your health care provider if a screening pelvic exam is right for you.  If you have had past treatment for cervical cancer or a condition that could lead to cancer, you need Pap tests and screening for cancer for at least 20 years after your treatment. If Pap tests have been discontinued, your risk factors (such as having a new sexual partner) need to be reassessed to determine if screening should resume. Some women have medical problems that increase the chance of getting cervical cancer. In these cases, your health care provider may recommend more frequent screening and Pap tests. Colorectal Cancer  This type of cancer can be detected and often prevented.  Routine colorectal cancer screening usually begins at 49 years of age and continues through 49 years of age.  Your health care provider may recommend screening at an earlier age if you have risk factors for colon cancer.  Your health care provider may also recommend using home test kits to check for hidden blood in the stool.  A small camera at the end of a tube can be used to examine your colon directly (sigmoidoscopy or colonoscopy).  This is done to check for the earliest forms of colorectal cancer.  Routine screening usually begins at age 52.  Direct examination of the colon should be repeated every 5-10 years through 49 years of age. However, you may need to be screened more often if early forms of precancerous polyps or small growths are found. Skin Cancer  Check your skin from head to toe regularly.  Tell your health care provider about any new moles or changes in moles, especially if there is a change in a mole's shape or color.  Also tell your health care provider if you have a mole that is larger than the size of a pencil eraser.  Always use sunscreen. Apply sunscreen liberally and repeatedly throughout the day.  Protect yourself by wearing long sleeves, pants, a wide-brimmed hat, and sunglasses whenever you are outside. HEART DISEASE, DIABETES, AND HIGH BLOOD PRESSURE   High blood pressure causes heart disease and increases the risk of stroke. High blood pressure  is more likely to develop in:  People who have blood pressure in the high end of the normal range (130-139/85-89 mm Hg).  People who are overweight or obese.  People who are African American.  If you are 105-22 years of age, have your blood pressure checked every 3-5 years. If you are 70 years of age or older, have your blood pressure checked every year. You should have your blood pressure measured twice--once when you are at a hospital or clinic, and once when you are not at a hospital or clinic. Record the average of the two measurements. To check your blood pressure when you are not at a hospital or clinic, you can use:  An automated blood pressure machine at a pharmacy.  A home blood pressure monitor.  If you are between 41 years and 15 years old, ask your health care provider if you should take aspirin to prevent strokes.  Have regular diabetes screenings. This involves taking a blood sample to check your fasting blood sugar level.  If you  are at a normal weight and have a low risk for diabetes, have this test once every three years after 49 years of age.  If you are overweight and have a high risk for diabetes, consider being tested at a younger age or more often. PREVENTING INFECTION  Hepatitis B  If you have a higher risk for hepatitis B, you should be screened for this virus. You are considered at high risk for hepatitis B if:  You were born in a country where hepatitis B is common. Ask your health care provider which countries are considered high risk.  Your parents were born in a high-risk country, and you have not been immunized against hepatitis B (hepatitis B vaccine).  You have HIV or AIDS.  You use needles to inject street drugs.  You live with someone who has hepatitis B.  You have had sex with someone who has hepatitis B.  You get hemodialysis treatment.  You take certain medicines for conditions, including cancer, organ transplantation, and autoimmune conditions. Hepatitis C  Blood testing is recommended for:  Everyone born from [birth year] through 1964.  Anyone with known risk factors for hepatitis C. Sexually transmitted infections (STIs)  You should be screened for sexually transmitted infections (STIs) including gonorrhea and chlamydia if:  You are sexually active and are younger than 49 years of age.  You are older than 49 years of age and your health care provider tells you that you are at risk for this type of infection.  Your sexual activity has changed since you were last screened and you are at an increased risk for chlamydia or gonorrhea. Ask your health care provider if you are at risk.  If you do not have HIV, but are at risk, it may be recommended that you take a prescription medicine daily to prevent HIV infection. This is called pre-exposure prophylaxis (PrEP). You are considered at risk if:  You are sexually active and do not regularly use condoms or know the HIV status of your  partner(s).  You take drugs by injection.  You are sexually active with a partner who has HIV. Talk with your health care provider about whether you are at high risk of being infected with HIV. If you choose to begin PrEP, you should first be tested for HIV. You should then be tested every 3 months for as long as you are taking PrEP.  PREGNANCY   If you are premenopausal and  you may become pregnant, ask your health care provider about preconception counseling.  If you may become pregnant, take 400 to 800 micrograms (mcg) of folic acid every day.  If you want to prevent pregnancy, talk to your health care provider about birth control (contraception). OSTEOPOROSIS AND MENOPAUSE   Osteoporosis is a disease in which the bones lose minerals and strength with aging. This can result in serious bone fractures. Your risk for osteoporosis can be identified using a bone density scan.  If you are 53 years of age or older, or if you are at risk for osteoporosis and fractures, ask your health care provider if you should be screened.  Ask your health care provider whether you should take a calcium or vitamin D supplement to lower your risk for osteoporosis.  Menopause may have certain physical symptoms and risks.  Hormone replacement therapy may reduce some of these symptoms and risks. Talk to your health care provider about whether hormone replacement therapy is right for you.  HOME CARE INSTRUCTIONS   Schedule regular health, dental, and eye exams.  Stay current with your immunizations.   Do not use any tobacco products including cigarettes, chewing tobacco, or electronic cigarettes.  If you are pregnant, do not drink alcohol.  If you are breastfeeding, limit how much and how often you drink alcohol.  Limit alcohol intake to no more than 1 drink per day for nonpregnant women. One drink equals 12 ounces of beer, 5 ounces of wine, or 1 ounces of hard liquor.  Do not use street drugs.  Do  not share needles.  Ask your health care provider for help if you need support or information about quitting drugs.  Tell your health care provider if you often feel depressed.  Tell your health care provider if you have ever been abused or do not feel safe at home.   This information is not intended to replace advice given to you by your health care provider. Make sure you discuss any questions you have with your health care provider.   Document Released: 03/23/2011 Document Revised: 09/28/2014 Document Reviewed: 08/09/2013 Elsevier Interactive Patient Education [employer].

## 2016-01-03 ENCOUNTER — Encounter: Payer: Self-pay | Admitting: Internal Medicine

## 2016-01-10 ENCOUNTER — Telehealth: Payer: Self-pay | Admitting: Internal Medicine

## 2016-01-10 NOTE — Telephone Encounter (Signed)
Patient states she was charged a 25.00 form completion charge.  States she has not had forms dropped off to be completed.  Patient would like to know if it is possible she received the charge her sister was suppose to have received for dropping forms off to be completed.  Can you please look into this.

## 2016-01-10 NOTE — Telephone Encounter (Signed)
Left patient vm to call back in regards. °

## 2016-01-10 NOTE — Telephone Encounter (Signed)
Pt had a handicap placard [facility] and scanned on 09/19/2015.  This is possibly where the charge originated.

## 2016-01-13 NOTE — Telephone Encounter (Signed)
Patient states that this bill was her sister not her. Could you please pull up the handicap form and have it changed to her sister. She says she does not even live around here.

## 2016-01-16 NOTE — Telephone Encounter (Signed)
Sending email to charge correction to delete charge that was placed on the account. Will call the patient to notify

## 2016-03-23 ENCOUNTER — Ambulatory Visit (INDEPENDENT_AMBULATORY_CARE_PROVIDER_SITE_OTHER): Payer: Managed Care, Other (non HMO) | Admitting: Internal Medicine

## 2016-03-23 ENCOUNTER — Encounter: Payer: Self-pay | Admitting: Internal Medicine

## 2016-03-23 VITALS — BP 100/60 | HR 78 | Temp 99.3°F | Resp 12 | Ht 64.0 in | Wt 111.0 lb

## 2016-03-23 DIAGNOSIS — M79662 Pain in left lower leg: Secondary | ICD-10-CM | POA: Diagnosis not present

## 2016-03-23 MED ORDER — DICLOFENAC SODIUM 1 % TD GEL: 2.0000 g | Freq: Three times a day (TID) | TRANSDERMAL | Status: DC | PRN

## 2016-03-23 NOTE — Progress Notes (Signed)
Pre visit review using our clinic review tool, if applicable. No additional management support is needed unless otherwise documented below in the visit note. 

## 2016-03-23 NOTE — Assessment & Plan Note (Signed)
Rx for voltaren gel to spare her stomach. No signs of muscle tear. No swelling or skin change. 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

## 2016-03-23 NOTE — Progress Notes (Signed)
   Subjective:    Patient ID: [name] AcheRita M Eblin, female    DOB: 1966/02/06, 50 y.o.   MRN: [id number]  HPI The patient is a 50 YO female coming in for left leg pain in the muscles for about 6 weeks. Overall is stable to worsening slightly. She denies any injury to the area. No new exercise or increased activity. No swelling of the leg or rash to the area. She has tried ibuprofen and it helps well when she needs it. No recent long travel.   Review of Systems  Constitutional: Negative for fever, activity change, appetite change, fatigue and unexpected weight change.  Respiratory: Negative for cough, chest tightness, shortness of breath and wheezing.   Cardiovascular: Negative for chest pain, palpitations and leg swelling.  Gastrointestinal: Negative for nausea, abdominal pain, diarrhea, constipation and abdominal distention.  Genitourinary: Negative for dysuria, urgency, frequency, hematuria, flank pain and difficulty urinating.  Musculoskeletal: Positive for myalgias. Negative for back pain and arthralgias.       Left calf pain  Skin: Negative.       Objective:   Physical Exam  Constitutional: She is oriented to person, place, and time. She appears well-developed and well-nourished.  HENT:  Head: Normocephalic and atraumatic.  Eyes: EOM are normal.  Neck: Normal range of motion.  Cardiovascular: Normal rate and regular rhythm.   No murmur heard. Pulmonary/Chest: Effort normal and breath sounds normal. No respiratory distress. She has no wheezes. She has no rales.  Abdominal: Soft. Bowel sounds are normal. She exhibits no distension. There is no tenderness. There is no rebound.  Musculoskeletal: Normal range of motion.  Mild soreness in the calf muscles, no skin color change and no swelling.   Neurological: She is alert and oriented to person, place, and time. Coordination normal.  Skin: Skin is warm and dry.   Filed Vitals:   03/23/16 1307  BP: 100/60  Pulse: 78  Temp: 99.3 F (37.4  C)  TempSrc: Oral  Resp: 12  Height: 5\' 4"  (1.626 m)  Weight: 111 lb (50.349 kg)  SpO2: 98%      Assessment & Plan:

## 2016-03-23 NOTE — Patient Instructions (Signed)
It is most likely a muscle problem in the leg. You can use ice on the area to help. Some light stretching exercises may be helpful.  We have sent in voltaren gel which is like ibuprofen but in cream form so you don't have to bother your stomach.

## 2016-03-25 NOTE — Telephone Encounter (Signed)
Patient called again asking about ultrasound? Please call her back ASAP. Thanks.

## 2016-03-25 NOTE — Telephone Encounter (Signed)
Patient called again about this. Can you follow up?

## 2016-03-26 ENCOUNTER — Ambulatory Visit (HOSPITAL_COMMUNITY)
Admission: RE | Admit: 2016-03-26 | Discharge: 2016-03-26 | Disposition: A | Discharge status: Home / Self Care | Payer: Managed Care, Other (non HMO) | Source: Ambulatory Visit | Attending: Internal Medicine | Admitting: Internal Medicine

## 2016-03-26 ENCOUNTER — Telehealth: Payer: Self-pay | Admitting: Geriatric Medicine

## 2016-03-26 ENCOUNTER — Telehealth: Payer: Self-pay

## 2016-03-26 ENCOUNTER — Other Ambulatory Visit: Payer: Self-pay | Admitting: Internal Medicine

## 2016-03-26 ENCOUNTER — Encounter: Payer: Self-pay | Admitting: Internal Medicine

## 2016-03-26 DIAGNOSIS — M79662 Pain in left lower leg: Secondary | ICD-10-CM | POA: Insufficient documentation

## 2016-03-26 DIAGNOSIS — R52 Pain, unspecified: Secondary | ICD-10-CM

## 2016-03-26 NOTE — Progress Notes (Signed)
VASCULAR LAB PRELIMINARY  PRELIMINARY  PRELIMINARY  PRELIMINARY  Left lower extremity venous duplex completed.    Preliminary report:  Left:  No evidence of DVT, superficial thrombosis, or Baker's cyst.   Akeela Busk, RVS 03/26/2016, 5:17 PM

## 2016-03-26 NOTE — Telephone Encounter (Signed)
[place] called from the vascular lab to inform you that the patient has a negative left lower extremity venous exam. Do you have any instructions for this patient? Please advise, thanks

## 2016-03-26 NOTE — Telephone Encounter (Signed)
PA initiated via CoverMyMeds key [id number]

## 2016-03-26 NOTE — Telephone Encounter (Signed)
Placed the order but I cannot say that it will be done this week since it is Thursday already.

## 2016-03-26 NOTE — Progress Notes (Signed)
VASCULAR LAB PRELIMINARY  PRELIMINARY  PRELIMINARY  PRELIMINARY  Left lower extremity venous duplex completed.    Preliminary report:  Left:  No evidence of DVT, superficial thrombosis, or Baker's cyst.  [name] [name], RVS 03/26/2016, 4:53 PM

## 2016-03-26 NOTE — Telephone Encounter (Signed)
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

## 2016-03-26 NOTE — Telephone Encounter (Signed)
PA DENIED - pt must have history of asthma, urticaria, or other allergic-type reactions after taking aspirin or other nonsteroidal anti-inflammatory drugs (NSAIDs), use of the requested drug in the setting of coronary artery bypass graft (CABG) surgery, or have osteoarthritis pain in joints susceptible to topical treatment such as feet, ankles, knees, hands, wrist, and elbow

## 2016-03-27 NOTE — Telephone Encounter (Signed)
Nothing new, will wait for full report.

## 2016-03-27 NOTE — Telephone Encounter (Signed)
Appeal letter generated, signed and faxed to   [id number] [address] [address] [place], [place] [id number] Fax: [phone]

## 2016-04-01 NOTE — Telephone Encounter (Signed)
The appeal was DENIED - letter states that "plan criteria only allow coverage if the patient is unable to tolerate or not a suitable candidate for oral NSAID therapy".

## 2016-04-02 NOTE — Telephone Encounter (Signed)
Detailed message left advising pt of same

## 2016-04-02 NOTE — Telephone Encounter (Signed)
Noted, patient can choose to purchase if wanted.

## 2016-07-30 ENCOUNTER — Other Ambulatory Visit: Payer: Self-pay | Admitting: General Surgery

## 2016-07-30 DIAGNOSIS — N6310 Unspecified lump in the right breast, unspecified quadrant: Secondary | ICD-10-CM

## 2016-08-03 ENCOUNTER — Other Ambulatory Visit: Payer: Self-pay | Admitting: Obstetrics and Gynecology

## 2016-08-03 DIAGNOSIS — Z1231 Encounter for screening mammogram for malignant neoplasm of breast: Secondary | ICD-10-CM

## 2016-08-07 ENCOUNTER — Encounter: Payer: Self-pay | Admitting: Internal Medicine

## 2016-08-25 ENCOUNTER — Encounter: Payer: Self-pay | Admitting: Internal Medicine

## 2016-09-22 ENCOUNTER — Ambulatory Visit
Admission: RE | Admit: 2016-09-22 | Discharge: 2016-09-22 | Disposition: A | Discharge status: Home / Self Care | Payer: Managed Care, Other (non HMO) | Source: Ambulatory Visit | Attending: Obstetrics and Gynecology | Admitting: Obstetrics and Gynecology

## 2016-09-22 DIAGNOSIS — Z1231 Encounter for screening mammogram for malignant neoplasm of breast: Secondary | ICD-10-CM

## 2016-09-28 ENCOUNTER — Encounter: Payer: Managed Care, Other (non HMO) | Admitting: Internal Medicine

## 2016-10-02 ENCOUNTER — Encounter: Payer: Managed Care, Other (non HMO) | Admitting: Internal Medicine

## 2016-10-03 ENCOUNTER — Other Ambulatory Visit: Payer: Managed Care, Other (non HMO)

## 2016-10-05 ENCOUNTER — Encounter: Payer: Managed Care, Other (non HMO) | Admitting: Internal Medicine

## 2016-10-08 ENCOUNTER — Telehealth: Payer: Self-pay | Admitting: Internal Medicine

## 2016-10-08 DIAGNOSIS — Z0000 Encounter for general adult medical examination without abnormal findings: Secondary | ICD-10-CM

## 2016-10-08 NOTE — Telephone Encounter (Signed)
Patient was scheduled for 8am 1/19 but since we are opening late I have moved patient to 2pm b/c she is coming in from [place] for [place] her doctors appts.  She is requesting labs to be entered when we open and a call to let her know.  Told patient we would try to do this.

## 2016-10-09 ENCOUNTER — Encounter: Payer: Managed Care, Other (non HMO) | Admitting: Internal Medicine

## 2016-10-09 ENCOUNTER — Ambulatory Visit
Admission: RE | Admit: 2016-10-09 | Discharge: 2016-10-09 | Disposition: A | Discharge status: Home / Self Care | Payer: Managed Care, Other (non HMO) | Source: Ambulatory Visit | Attending: General Surgery | Admitting: General Surgery

## 2016-10-09 ENCOUNTER — Encounter: Payer: Self-pay | Admitting: Internal Medicine

## 2016-10-09 ENCOUNTER — Ambulatory Visit (INDEPENDENT_AMBULATORY_CARE_PROVIDER_SITE_OTHER): Payer: Managed Care, Other (non HMO) | Admitting: Internal Medicine

## 2016-10-09 DIAGNOSIS — N6310 Unspecified lump in the right breast, unspecified quadrant: Secondary | ICD-10-CM

## 2016-10-09 DIAGNOSIS — Z0000 Encounter for general adult medical examination without abnormal findings: Secondary | ICD-10-CM | POA: Diagnosis not present

## 2016-10-09 MED ORDER — GADOBENATE DIMEGLUMINE 529 MG/ML IV SOLN
10.0000 mL | Freq: Once | INTRAVENOUS | Status: AC | PRN
  Administered 2016-10-09: 10 mL via INTRAVENOUS

## 2016-10-09 NOTE — Telephone Encounter (Signed)
Notified patient.

## 2016-10-09 NOTE — Telephone Encounter (Signed)
Labs placed.

## 2016-10-09 NOTE — Progress Notes (Signed)
Pre visit review using our clinic review tool, if applicable. No additional management support is needed unless otherwise documented below in the visit note. 

## 2016-10-09 NOTE — Progress Notes (Signed)
   Subjective:    Patient ID: [name] [name], female    DOB: [birth year], 51 y.o.   MRN: [id number]  HPI The patient is a 51 YO female coming in for wellness. No new concerns.   [facility], [facility], social history reviewed and updated.   Review of Systems  Constitutional: Negative.   HENT: Negative.   Eyes: Negative.   Respiratory: Negative for cough, chest tightness and shortness of breath.   Cardiovascular: Negative for chest pain, palpitations and leg swelling.  Gastrointestinal: Negative for abdominal distention, abdominal pain, constipation, diarrhea, nausea and vomiting.  Musculoskeletal: Negative.   Skin: Negative.   Neurological: Negative.   Psychiatric/Behavioral: Negative.       Objective:   Physical Exam  Constitutional: She is oriented to person, place, and time. She appears well-developed and well-nourished.  HENT:  Head: Normocephalic and atraumatic.  Eyes: EOM are normal.  Neck: Normal range of motion.  Cardiovascular: Normal rate and regular rhythm.   Pulmonary/Chest: Effort normal and breath sounds normal. No respiratory distress. She has no wheezes. She has no rales.  Abdominal: Soft. Bowel sounds are normal. She exhibits no distension. There is no tenderness. There is no rebound.  Musculoskeletal: She exhibits no edema.  Neurological: She is alert and oriented to person, place, and time. Coordination normal.  Skin: Skin is warm and dry.  Psychiatric: She has a normal mood and affect.   Vitals:   10/09/16 1411  BP: (!) 96/58  Pulse: 91  Resp: 12  Temp: 98.6 F (37 C)  TempSrc: Oral  SpO2: 99%  Weight: 109 lb (49.4 kg)  Height: 5\' 4"  (1.626 m)      Assessment & Plan:

## 2016-10-09 NOTE — Patient Instructions (Signed)
You can come back for labs when you are back in town, just head down to the lab.   Health Maintenance, Female Introduction Adopting a healthy lifestyle and getting preventive care can go a long way to promote health and wellness. Talk with your health care provider about what schedule of regular examinations is right for you. This is a good chance for you to check in with your provider about disease prevention and staying healthy. In between checkups, there are plenty of things you can do on your own. Experts have done a lot of research about which lifestyle changes and preventive measures are most likely to keep you healthy. Ask your health care provider for more information. Weight and diet Eat a healthy diet  Be sure to include plenty of vegetables, fruits, low-fat dairy products, and lean protein.  Do not eat a lot of foods high in solid fats, added sugars, or salt.  Get regular exercise. This is one of the most important things you can do for your health.  Most adults should exercise for at least 150 minutes each week. The exercise should increase your heart rate and make you sweat (moderate-intensity exercise).  Most adults should also do strengthening exercises at least twice a week. This is in addition to the moderate-intensity exercise. Maintain a healthy weight  Body mass index (BMI) is a measurement that can be used to identify possible weight problems. It estimates body fat based on height and weight. Your health care provider can help determine your BMI and help you achieve or maintain a healthy weight.  For females 27 years of age and older:  A BMI below 18.5 is considered underweight.  A BMI of 18.5 to 24.9 is normal.  A BMI of 25 to 29.9 is considered overweight.  A BMI of 30 and above is considered obese. Watch levels of cholesterol and blood lipids  You should start having your blood tested for lipids and cholesterol at 50 years of age, then have this test every 5  years.  You may need to have your cholesterol levels checked more often if:  Your lipid or cholesterol levels are high.  You are older than 50 years of age.  You are at high risk for heart disease. Cancer screening Lung Cancer  Lung cancer screening is recommended for adults 37-66 years old who are at high risk for lung cancer because of a history of smoking.  A yearly low-dose CT scan of the lungs is recommended for people who:  Currently smoke.  Have quit within the past 15 years.  Have at least a 30-pack-year history of smoking. A pack year is smoking an average of one pack of cigarettes a day for 1 year.  Yearly screening should continue until it has been 15 years since you quit.  Yearly screening should stop if you develop a health problem that would prevent you from having lung cancer treatment. Breast Cancer  Practice breast self-awareness. This means understanding how your breasts normally appear and feel.  It also means doing regular breast self-exams. Let your health care provider know about any changes, no matter how small.  If you are in your 20s or 30s, you should have a clinical breast exam (CBE) by a health care provider every 1-3 years as part of a regular health exam.  If you are 52 or older, have a CBE every year. Also consider having a breast X-ray (mammogram) every year.  If you have a family history of breast  cancer, talk to your health care provider about genetic screening.  If you are at high risk for breast cancer, talk to your health care provider about having an MRI and a mammogram every year.  Breast cancer gene (BRCA) assessment is recommended for women who have family members with BRCA-related cancers. BRCA-related cancers include:  Breast.  Ovarian.  Tubal.  Peritoneal cancers.  Results of the assessment will determine the need for genetic counseling and BRCA1 and BRCA2 testing. Cervical Cancer  Your health care provider may recommend  that you be screened regularly for cancer of the pelvic organs (ovaries, uterus, and vagina). This screening involves a pelvic examination, including checking for microscopic changes to the surface of your cervix (Pap test). You may be encouraged to have this screening done every 3 years, beginning at age 47.  For women ages 31-65, health care providers may recommend pelvic exams and Pap testing every 3 years, or they may recommend the Pap and pelvic exam, combined with testing for human papilloma virus (HPV), every 5 years. Some types of HPV increase your risk of cervical cancer. Testing for HPV may also be done on women of any age with unclear Pap test results.  Other health care providers may not recommend any screening for nonpregnant women who are considered low risk for pelvic cancer and who do not have symptoms. Ask your health care provider if a screening pelvic exam is right for you.  If you have had past treatment for cervical cancer or a condition that could lead to cancer, you need Pap tests and screening for cancer for at least 20 years after your treatment. If Pap tests have been discontinued, your risk factors (such as having a new sexual partner) need to be reassessed to determine if screening should resume. Some women have medical problems that increase the chance of getting cervical cancer. In these cases, your health care provider may recommend more frequent screening and Pap tests. Colorectal Cancer  This type of cancer can be detected and often prevented.  Routine colorectal cancer screening usually begins at 50 years of age and continues through 50 years of age.  Your health care provider may recommend screening at an earlier age if you have risk factors for colon cancer.  Your health care provider may also recommend using home test kits to check for hidden blood in the stool.  A small camera at the end of a tube can be used to examine your colon directly (sigmoidoscopy or  colonoscopy). This is done to check for the earliest forms of colorectal cancer.  Routine screening usually begins at age 57.  Direct examination of the colon should be repeated every 5-10 years through 50 years of age. However, you may need to be screened more often if early forms of precancerous polyps or small growths are found. Skin Cancer  Check your skin from head to toe regularly.  Tell your health care provider about any new moles or changes in moles, especially if there is a change in a mole's shape or color.  Also tell your health care provider if you have a mole that is larger than the size of a pencil eraser.  Always use sunscreen. Apply sunscreen liberally and repeatedly throughout the day.  Protect yourself by wearing long sleeves, pants, a wide-brimmed hat, and sunglasses whenever you are outside. Heart disease, diabetes, and high blood pressure  High blood pressure causes heart disease and increases the risk of stroke. High blood pressure is more likely  to develop in:  People who have blood pressure in the high end of the normal range (130-139/85-89 mm Hg).  People who are overweight or obese.  People who are African American.  If you are 38-66 years of age, have your blood pressure checked every 3-5 years. If you are 1 years of age or older, have your blood pressure checked every year. You should have your blood pressure measured twice-once when you are at a hospital or clinic, and once when you are not at a hospital or clinic. Record the average of the two measurements. To check your blood pressure when you are not at a hospital or clinic, you can use:  An automated blood pressure machine at a pharmacy.  A home blood pressure monitor.  If you are between 63 years and 21 years old, ask your health care provider if you should take aspirin to prevent strokes.  Have regular diabetes screenings. This involves taking a blood sample to check your fasting blood sugar  level.  If you are at a normal weight and have a low risk for diabetes, have this test once every three years after 50 years of age.  If you are overweight and have a high risk for diabetes, consider being tested at a younger age or more often. Preventing infection Hepatitis B  If you have a higher risk for hepatitis B, you should be screened for this virus. You are considered at high risk for hepatitis B if:  You were born in a country where hepatitis B is common. Ask your health care provider which countries are considered high risk.  Your parents were born in a high-risk country, and you have not been immunized against hepatitis B (hepatitis B vaccine).  You have HIV or AIDS.  You use needles to inject street drugs.  You live with someone who has hepatitis B.  You have had sex with someone who has hepatitis B.  You get hemodialysis treatment.  You take certain medicines for conditions, including cancer, organ transplantation, and autoimmune conditions. Hepatitis C  Blood testing is recommended for:  Everyone born from [birth year] through 1964.  Anyone with known risk factors for hepatitis C. Sexually transmitted infections (STIs)  You should be screened for sexually transmitted infections (STIs) including gonorrhea and chlamydia if:  You are sexually active and are younger than 50 years of age.  You are older than 50 years of age and your health care provider tells you that you are at risk for this type of infection.  Your sexual activity has changed since you were last screened and you are at an increased risk for chlamydia or gonorrhea. Ask your health care provider if you are at risk.  If you do not have HIV, but are at risk, it may be recommended that you take a prescription medicine daily to prevent HIV infection. This is called pre-exposure prophylaxis (PrEP). You are considered at risk if:  You are sexually active and do not regularly use condoms or know the HIV status  of your partner(s).  You take drugs by injection.  You are sexually active with a partner who has HIV. Talk with your health care provider about whether you are at high risk of being infected with HIV. If you choose to begin PrEP, you should first be tested for HIV. You should then be tested every 3 months for as long as you are taking PrEP. Pregnancy  If you are premenopausal and you may become pregnant, ask your  health care provider about preconception counseling.  If you may become pregnant, take 400 to 800 micrograms (mcg) of folic acid every day.  If you want to prevent pregnancy, talk to your health care provider about birth control (contraception). Osteoporosis and menopause  Osteoporosis is a disease in which the bones lose minerals and strength with aging. This can result in serious bone fractures. Your risk for osteoporosis can be identified using a bone density scan.  If you are 42 years of age or older, or if you are at risk for osteoporosis and fractures, ask your health care provider if you should be screened.  Ask your health care provider whether you should take a calcium or vitamin D supplement to lower your risk for osteoporosis.  Menopause may have certain physical symptoms and risks.  Hormone replacement therapy may reduce some of these symptoms and risks. Talk to your health care provider about whether hormone replacement therapy is right for you. Follow these instructions at home:  Schedule regular health, dental, and eye exams.  Stay current with your immunizations.  Do not use any tobacco products including cigarettes, chewing tobacco, or electronic cigarettes.  If you are pregnant, do not drink alcohol.  If you are breastfeeding, limit how much and how often you drink alcohol.  Limit alcohol intake to no more than 1 drink per day for nonpregnant women. One drink equals 12 ounces of beer, 5 ounces of wine, or 1 ounces of hard liquor.  Do not use street  drugs.  Do not share needles.  Ask your health care provider for help if you need support or information about quitting drugs.  Tell your health care provider if you often feel depressed.  Tell your health care provider if you have ever been abused or do not feel safe at home. This information is not intended to replace advice given to you by your health care provider. Make sure you discuss any questions you have with your health care provider. Document Released: 03/23/2011 Document Revised: 02/13/2016 Document Reviewed: 06/11/2015  2017 [employer]vier

## 2016-10-09 NOTE — Assessment & Plan Note (Signed)
Flu shot and tetanus up to date. Does yearly mammogram and MR breast reviewed with her today. Counseled about sun safety and mole surveillance. Counseled about dangers of distracted driving. Given screening recommendations. Declines need for HIV screening.

## 2017-01-12 ENCOUNTER — Encounter: Payer: Self-pay | Admitting: Internal Medicine

## 2017-01-12 DIAGNOSIS — Z0000 Encounter for general adult medical examination without abnormal findings: Secondary | ICD-10-CM

## 2017-01-14 ENCOUNTER — Other Ambulatory Visit (INDEPENDENT_AMBULATORY_CARE_PROVIDER_SITE_OTHER): Payer: Managed Care, Other (non HMO)

## 2017-01-14 DIAGNOSIS — Z0000 Encounter for general adult medical examination without abnormal findings: Secondary | ICD-10-CM | POA: Diagnosis not present

## 2017-01-14 LAB — URINALYSIS, ROUTINE W REFLEX MICROSCOPIC
LEUKOCYTES UA: NEGATIVE
Nitrite: NEGATIVE
SPECIFIC GRAVITY, URINE: 1.02 (ref 1.000–1.030)
Total Protein, Urine: NEGATIVE
Urine Glucose: NEGATIVE
Urobilinogen, UA: 0.2 (ref 0.0–1.0)
pH: 6.5 (ref 5.0–8.0)

## 2017-01-14 LAB — CBC
HCT: 41.6 % (ref 36.0–46.0)
Hemoglobin: 13.8 g/dL (ref 12.0–15.0)
MCHC: 33.2 g/dL (ref 30.0–36.0)
MCV: 90.3 fl (ref 78.0–100.0)
PLATELETS: 283 10*3/uL (ref 150.0–400.0)
RBC: 4.6 Mil/uL (ref 3.87–5.11)
RDW: 13.3 % (ref 11.5–15.5)
WBC: 7.8 10*3/uL (ref 4.0–10.5)

## 2017-01-14 LAB — COMPREHENSIVE METABOLIC PANEL
ALT: 10 U/L (ref 0–35)
AST: 13 U/L (ref 0–37)
Albumin: 4.1 g/dL (ref 3.5–5.2)
Alkaline Phosphatase: 39 U/L (ref 39–117)
BILIRUBIN TOTAL: 0.4 mg/dL (ref 0.2–1.2)
BUN: 13 mg/dL (ref 6–23)
CO2: 30 meq/L (ref 19–32)
CREATININE: 0.71 mg/dL (ref 0.40–1.20)
Calcium: 8.9 mg/dL (ref 8.4–10.5)
Chloride: 100 mEq/L (ref 96–112)
GFR: 92.77 mL/min (ref >60.00)
GLUCOSE: 86 mg/dL (ref 70–99)
Potassium: 3.4 mEq/L — ABNORMAL LOW (ref 3.5–5.1)
Sodium: 135 mEq/L (ref 135–145)
Total Protein: 7.1 g/dL (ref 6.0–8.3)

## 2017-01-14 LAB — LIPID PANEL
CHOL/HDL RATIO: 3
Cholesterol: 230 mg/dL — ABNORMAL HIGH (ref 0–200)
HDL: 74.4 mg/dL (ref >39.00)
LDL Cholesterol: 137 mg/dL — ABNORMAL HIGH (ref 0–99)
NONHDL: 155.25
Triglycerides: 90 mg/dL (ref 0.0–149.0)
VLDL: 18 mg/dL (ref 0.0–40.0)

## 2017-01-14 LAB — HEMOGLOBIN A1C: Hgb A1c MFr Bld: 6 % (ref 4.6–6.5)

## 2017-06-14 ENCOUNTER — Encounter: Payer: Managed Care, Other (non HMO) | Admitting: Internal Medicine

## 2017-06-14 ENCOUNTER — Ambulatory Visit (INDEPENDENT_AMBULATORY_CARE_PROVIDER_SITE_OTHER): Payer: Managed Care, Other (non HMO) | Admitting: Family Medicine

## 2017-06-14 ENCOUNTER — Encounter: Payer: Self-pay | Admitting: Internal Medicine

## 2017-06-14 ENCOUNTER — Encounter: Payer: Self-pay | Admitting: Family Medicine

## 2017-06-14 VITALS — BP 116/82 | HR 80 | Temp 98.0°F | Ht 64.0 in | Wt 112.8 lb

## 2017-06-14 DIAGNOSIS — M7502 Adhesive capsulitis of left shoulder: Secondary | ICD-10-CM | POA: Insufficient documentation

## 2017-06-14 MED ORDER — TRIAMCINOLONE ACETONIDE 40 MG/ML IJ SUSP
40.0000 mg | Freq: Once | INTRAMUSCULAR | Status: AC
  Administered 2017-06-14: 40 mg via INTRAMUSCULAR

## 2017-06-14 NOTE — Progress Notes (Signed)
Patient ID: [name] [name], female   DOB: 1966/01/10, 51 y.o.   MRN: [id number] Error, patient to be seen by Dr. Jordan Likes instead.

## 2017-06-14 NOTE — Assessment & Plan Note (Addendum)
Findings are suggestive of an early capsulitis. Ultrasound exam is reassuring that it doesn't appear to be a rotator cuff. - Intra-articular injection performed today - Provided home exercises and counseled - If no improvement would consider formal physical therapy. May consider obtaining an x-rays - Follow-up of 4-6 weeks.

## 2017-06-14 NOTE — Progress Notes (Signed)
[name] [name] - 51 y.o. female MRN [id number]  Date of birth: [birth year]  SUBJECTIVE:  Including CC & ROS.  Chief Complaint  Patient presents with  . Arm Problem    Patient is here today C/O left upper arm pain and decreased ROM x 3 months.  It radiates into the left shoulder blade.  At certain angles it causes a throbbing sensation.    [name] [name] is a 51 year old female is presenting with left shoulder pain. She reports the pain has been ongoing for 3 months. She has tried Motrin with no relief of the pain. She has some radiation down the left lateral aspect of her arm to her elbow. She denies any injury or inciting event. It is throbbing in nature. She has not had any similar symptoms like this previously. No prior surgery on her shoulder. Pain is worse with certain movements. Nothing seems to help her symptoms.     Review of Systems  Constitutional: Negative for fever.  Musculoskeletal: Positive for myalgias. Negative for gait problem and joint swelling.  Skin: Negative for color change.  Neurological: Negative for weakness and numbness.   otherwise negative  HISTORY: Past Medical, Surgical, Social, and Family History Reviewed & Updated per EMR.   Pertinent Historical Findings include:  Past Medical History:  Diagnosis Date  . Endometriosis, site unspecified   . Gastritis   . History of measles   . History of mumps   . Insomnia, unspecified   . Internal hemorrhoids without mention of complication   . Lumbago   . Personal history of other diseases of digestive system   . Seizure, febrile (HCC)   . Tuberculin test reaction   . Vasovagal syncope     Past Surgical History:  Procedure Laterality Date  . LAPAROSCOPY  2006   for endometriosis with scar tissue  . ROBOTIC ASSISTED LAP VAGINAL HYSTERECTOMY  12/2008    Allergies  Allergen Reactions  . Epinephrine Palpitations and Shortness Of Breath  . Penicillins     Family History  Problem Relation Age of Onset  .  Esophageal cancer Father   . Schizophrenia Father   . Diabetes Father   . Arthritis Mother   . Coronary artery disease Mother   . Hypertension Mother   . Diabetes Mother   . Allergic rhinitis Sister   . Diabetes Sister   . Hyperlipidemia Sister   . Breast cancer Maternal Aunt   . Colon cancer Paternal Aunt   . Asthma Brother      Social History   Social History  . Marital status: Single    Spouse name: N/A  . Number of children: 0  . Years of education: 47   Occupational History  . Customer Service [employer]  .  [employer]   Social History Main Topics  . Smoking status: Never Smoker  . Smokeless tobacco: Never Used  . Alcohol use No  . Drug use: No  . Sexual activity: No   Other Topics Concern  . Not on file   Social History Narrative   One of 4 children with a twin brother. HSG, BA-pscyh/counseling. Single; shares a house w/sister. Mother is here for an extended visit (Dec '13). 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  Extended family in [place] and [place]              PHYSICAL EXAM:  VS: BP 116/82 (BP Location: Left Arm, Patient Position: Sitting, Cuff Size: Normal)   Pulse 80   Temp 98  F (36.7 C) (Oral)   Ht  (1.626 m)   Wt 112 lb 12.8 oz (51.2 kg)   SpO2 99%   BMI 19.36 kg/m  Physical Exam Gen: NAD, alert, cooperative with exam, well-appearing ENT: normal lips, normal nasal mucosa,  Eye: normal EOM, normal conjunctiva and lids CV:  no edema, +2 pedal pulses   Resp: no accessory muscle use, non-labored,  GI: no masses or tenderness, no hernia  Skin: no rashes, no areas of induration  Neuro: normal tone, normal sensation to touch Psych:  normal insight, alert and oriented MSK:  Left shoulder: Limited active flexion to about 110. Limited abduction to about 100. Passive range of motion to about 130 in flexion and 110 in abduction. Limited external rotation to 15 whereas on the right it's around 45. Pain with external  rotation and abduction. Some pain with empty can testing  Normal internal and rotation strength resistance. Normal speeds test. Neurovascularly intact.   Limited ultrasound: Left shoulder:  Biceps tendon was uterine short and long axis and appears to be normal Subscapularis had a cystic formation but normal tendon appearance and static and dynamic testing. Supraspinatus appeared normal and static and dynamic testing. Infraspinatus was normal and static testing.   Summary: Normal exam except for the cystic changes within the subscapularis tendon  Ultrasound and interpretation by Clare Gandy, MD                   Aspiration/Injection Procedure Note [name] [name] June 20, 1966  Procedure: Injection Indications: Left shoulder pain  Procedure Details Consent: Risks of procedure as well as the alternatives and risks of each were explained to the (patient/caregiver).  Consent for procedure obtained. Time Out: Verified patient identification, verified procedure, site/side was marked, verified correct patient position, special equipment/implants available, medications/allergies/relevent history reviewed, required imaging and test results available.  Performed.  The area was cleaned with iodine and alcohol swabs.    The left shoulder glenohumeral joint was injected using 1 cc's of 40 mg Kenalog and 4 cc's of 0.5% Marcaine with a 25 1 1/2" needle.  Ultrasound was used. Images were obtained in Transverse views showing the injection.    A sterile dressing was applied.  Patient did tolerate procedure well.        ASSESSMENT & PLAN:   Adhesive capsulitis of left shoulder Findings are suggestive of an early capsulitis. Ultrasound exam is reassuring that it doesn't appear to be a rotator cuff. - Intra-articular injection performed today - Provided home exercises and counseled - If no improvement would consider formal physical therapy. May consider obtaining an  x-rays - Follow-up of 4-6 weeks.

## 2017-06-14 NOTE — Patient Instructions (Signed)
Thank you for coming in,   Please try the exercises on a regular basis.   Please follow up with me if there is no improvement.    Please feel free to call with any questions or concerns at any time, at [phone]. --Dr. Jordan Likes

## 2017-09-16 ENCOUNTER — Other Ambulatory Visit: Payer: Self-pay | Admitting: Obstetrics and Gynecology

## 2017-09-16 DIAGNOSIS — Z1231 Encounter for screening mammogram for malignant neoplasm of breast: Secondary | ICD-10-CM

## 2017-11-18 ENCOUNTER — Encounter: Payer: Self-pay | Admitting: Internal Medicine

## 2017-11-18 ENCOUNTER — Ambulatory Visit (INDEPENDENT_AMBULATORY_CARE_PROVIDER_SITE_OTHER): Payer: Managed Care, Other (non HMO) | Admitting: Internal Medicine

## 2017-11-18 ENCOUNTER — Other Ambulatory Visit (INDEPENDENT_AMBULATORY_CARE_PROVIDER_SITE_OTHER): Payer: Managed Care, Other (non HMO)

## 2017-11-18 ENCOUNTER — Ambulatory Visit
Admission: RE | Admit: 2017-11-18 | Discharge: 2017-11-18 | Disposition: A | Discharge status: Home / Self Care | Payer: Managed Care, Other (non HMO) | Source: Ambulatory Visit | Attending: Obstetrics and Gynecology | Admitting: Obstetrics and Gynecology

## 2017-11-18 ENCOUNTER — Ambulatory Visit: Payer: Managed Care, Other (non HMO)

## 2017-11-18 VITALS — BP 106/70 | HR 76 | Temp 97.9°F | Ht 64.0 in | Wt 111.0 lb

## 2017-11-18 DIAGNOSIS — M7502 Adhesive capsulitis of left shoulder: Secondary | ICD-10-CM

## 2017-11-18 DIAGNOSIS — Z0000 Encounter for general adult medical examination without abnormal findings: Secondary | ICD-10-CM | POA: Diagnosis not present

## 2017-11-18 DIAGNOSIS — Z1231 Encounter for screening mammogram for malignant neoplasm of breast: Secondary | ICD-10-CM

## 2017-11-18 LAB — COMPREHENSIVE METABOLIC PANEL
ALBUMIN: 4.3 g/dL (ref 3.5–5.2)
ALT: 14 U/L (ref 0–35)
AST: 18 U/L (ref 0–37)
Alkaline Phosphatase: 40 U/L (ref 39–117)
BUN: 14 mg/dL (ref 6–23)
CHLORIDE: 102 meq/L (ref 96–112)
CO2: 28 mEq/L (ref 19–32)
Calcium: 9.7 mg/dL (ref 8.4–10.5)
Creatinine, Ser: 0.75 mg/dL (ref 0.40–1.20)
GFR: 86.79 mL/min (ref >60.00)
GLUCOSE: 90 mg/dL (ref 70–99)
POTASSIUM: 3.6 meq/L (ref 3.5–5.1)
SODIUM: 140 meq/L (ref 135–145)
Total Bilirubin: 0.4 mg/dL (ref 0.2–1.2)
Total Protein: 7.9 g/dL (ref 6.0–8.3)

## 2017-11-18 LAB — LIPID PANEL
Cholesterol: 252 mg/dL — ABNORMAL HIGH (ref 0–200)
HDL: 76 mg/dL (ref >39.00)
LDL CALC: 160 mg/dL — AB (ref 0–99)
NonHDL: 175.56
Total CHOL/HDL Ratio: 3
Triglycerides: 78 mg/dL (ref 0.0–149.0)
VLDL: 15.6 mg/dL (ref 0.0–40.0)

## 2017-11-18 LAB — HEMOGLOBIN A1C: HEMOGLOBIN A1C: 6 % (ref 4.6–6.5)

## 2017-11-18 LAB — CBC
HEMATOCRIT: 39.4 % (ref 36.0–46.0)
Hemoglobin: 13.2 g/dL (ref 12.0–15.0)
MCHC: 33.4 g/dL (ref 30.0–36.0)
MCV: 90.1 fl (ref 78.0–100.0)
Platelets: 294 10*3/uL (ref 150.0–400.0)
RBC: 4.38 Mil/uL (ref 3.87–5.11)
RDW: 12.7 % (ref 11.5–15.5)
WBC: 5.3 10*3/uL (ref 4.0–10.5)

## 2017-11-18 LAB — URINALYSIS, ROUTINE W REFLEX MICROSCOPIC
Bilirubin Urine: NEGATIVE
HGB URINE DIPSTICK: NEGATIVE
Ketones, ur: NEGATIVE
Leukocytes, UA: NEGATIVE
NITRITE: NEGATIVE
RBC / HPF: NONE SEEN (ref 0–?)
Specific Gravity, Urine: 1.015 (ref 1.000–1.030)
TOTAL PROTEIN, URINE-UPE24: NEGATIVE
Urine Glucose: NEGATIVE
Urobilinogen, UA: 0.2 (ref 0.0–1.0)
pH: 7 (ref 5.0–8.0)

## 2017-11-18 LAB — T4, FREE: Free T4: 0.68 ng/dL (ref 0.60–1.60)

## 2017-11-18 LAB — VITAMIN D 25 HYDROXY (VIT D DEFICIENCY, FRACTURES): VITD: 34.73 ng/mL (ref 30.00–100.00)

## 2017-11-18 LAB — TSH: TSH: 7.48 u[IU]/mL — ABNORMAL HIGH (ref 0.35–4.50)

## 2017-11-18 NOTE — Assessment & Plan Note (Signed)
Colonoscopy scheduled for May 2019 given family history. Mammogram done, tetanus up to date. Declines shingles vaccine as did not have chicken pox and was vaccinated in 1997. She declines flu shot. Pap smear not indicated due to hysterectomy.

## 2017-11-18 NOTE — Assessment & Plan Note (Signed)
Still sore some, declines injection. Talked to her about PT and she will think about it.

## 2017-11-18 NOTE — Progress Notes (Signed)
   Subjective:    Patient ID: [name] [name], female    DOB: [birth year], 52 y.o.   MRN: [id number]  HPI The patient is a 52 YO female coming in for physical.   [facility], social history reviewed and updated.   Review of Systems  Constitutional: Negative.   HENT: Negative.   Eyes: Negative.   Respiratory: Negative for cough, chest tightness and shortness of breath.   Cardiovascular: Negative for chest pain, palpitations and leg swelling.  Gastrointestinal: Negative for abdominal distention, abdominal pain, constipation, diarrhea, nausea and vomiting.  Musculoskeletal: Positive for arthralgias.  Skin: Negative.   Neurological: Negative.   Psychiatric/Behavioral: Negative.       Objective:   Physical Exam  Constitutional: She is oriented to person, place, and time. She appears well-developed and well-nourished.  HENT:  Head: Normocephalic and atraumatic.  Eyes: EOM are normal.  Neck: Normal range of motion.  Cardiovascular: Normal rate and regular rhythm.  Pulmonary/Chest: Effort normal and breath sounds normal. No respiratory distress. She has no wheezes. She has no rales.  Abdominal: Soft. Bowel sounds are normal. She exhibits no distension. There is no tenderness. There is no rebound.  Musculoskeletal: She exhibits no edema.  Neurological: She is alert and oriented to person, place, and time. Coordination normal.  Skin: Skin is warm and dry.  Psychiatric: She has a normal mood and affect.   Vitals:   11/18/17 0825  BP: 106/70  Pulse: 76  Temp: 97.9 F (36.6 C)  TempSrc: Oral  SpO2: 98%  Weight: 111 lb (50.3 kg)  Height: 5\' 4"  (1.626 m)   EKG: Rate 62, axis normal, intervals normal, sinus, no st or t wave changes. No old for comparison.     Assessment & Plan:

## 2017-11-18 NOTE — Patient Instructions (Signed)
We have checked the EKG today and it is normal.   We will check the labs and send the results on mychart. I will look for the results of the mammogram and get those to you today or tomorrow.   We can do physical therapy on the shoulder or another injection when you are back in May if you need it.  Health Maintenance, Female Adopting a healthy lifestyle and getting preventive care can go a long way to promote health and wellness. Talk with your health care provider about what schedule of regular examinations is right for you. This is a good chance for you to check in with your provider about disease prevention and staying healthy. In between checkups, there are plenty of things you can do on your own. Experts have done a lot of research about which lifestyle changes and preventive measures are most likely to keep you healthy. Ask your health care provider for more information. Weight and diet Eat a healthy diet  Be sure to include plenty of vegetables, fruits, low-fat dairy products, and lean protein.  Do not eat a lot of foods high in solid fats, added sugars, or salt.  Get regular exercise. This is one of the most important things you can do for your health. ? Most adults should exercise for at least 150 minutes each week. The exercise should increase your heart rate and make you sweat (moderate-intensity exercise). ? Most adults should also do strengthening exercises at least twice a week. This is in addition to the moderate-intensity exercise.  Maintain a healthy weight  Body mass index (BMI) is a measurement that can be used to identify possible weight problems. It estimates body fat based on height and weight. Your health care provider can help determine your BMI and help you achieve or maintain a healthy weight.  For females 74 years of age and older: ? A BMI below 18.5 is considered underweight. ? A BMI of 18.5 to 24.9 is normal. ? A BMI of 25 to 29.9 is considered overweight. ? A  BMI of 30 and above is considered obese.  Watch levels of cholesterol and blood lipids  You should start having your blood tested for lipids and cholesterol at 51 years of age, then have this test every 5 years.  You may need to have your cholesterol levels checked more often if: ? Your lipid or cholesterol levels are high. ? You are older than 51 years of age. ? You are at high risk for heart disease.  Cancer screening Lung Cancer  Lung cancer screening is recommended for adults 51-47 years old who are at high risk for lung cancer because of a history of smoking.  A yearly low-dose CT scan of the lungs is recommended for people who: ? Currently smoke. ? Have quit within the past 15 years. ? Have at least a 30-pack-year history of smoking. A pack year is smoking an average of one pack of cigarettes a day for 1 year.  Yearly screening should continue until it has been 15 years since you quit.  Yearly screening should stop if you develop a health problem that would prevent you from having lung cancer treatment.  Breast Cancer  Practice breast self-awareness. This means understanding how your breasts normally appear and feel.  It also means doing regular breast self-exams. Let your health care provider know about any changes, no matter how small.  If you are in your 20s or 30s, you should have a clinical breast  exam (CBE) by a health care provider every 1-3 years as part of a regular health exam.  If you are 55 or older, have a CBE every year. Also consider having a breast X-ray (mammogram) every year.  If you have a family history of breast cancer, talk to your health care provider about genetic screening.  If you are at high risk for breast cancer, talk to your health care provider about having an MRI and a mammogram every year.  Breast cancer gene (BRCA) assessment is recommended for women who have family members with BRCA-related cancers. BRCA-related cancers  include: ? Breast. ? Ovarian. ? Tubal. ? Peritoneal cancers.  Results of the assessment will determine the need for genetic counseling and BRCA1 and BRCA2 testing.  Cervical Cancer Your health care provider may recommend that you be screened regularly for cancer of the pelvic organs (ovaries, uterus, and vagina). This screening involves a pelvic examination, including checking for microscopic changes to the surface of your cervix (Pap test). You may be encouraged to have this screening done every 3 years, beginning at age 63.  For women ages 44-65, health care providers may recommend pelvic exams and Pap testing every 3 years, or they may recommend the Pap and pelvic exam, combined with testing for human papilloma virus (HPV), every 5 years. Some types of HPV increase your risk of cervical cancer. Testing for HPV may also be done on women of any age with unclear Pap test results.  Other health care providers may not recommend any screening for nonpregnant women who are considered low risk for pelvic cancer and who do not have symptoms. Ask your health care provider if a screening pelvic exam is right for you.  If you have had past treatment for cervical cancer or a condition that could lead to cancer, you need Pap tests and screening for cancer for at least 20 years after your treatment. If Pap tests have been discontinued, your risk factors (such as having a new sexual partner) need to be reassessed to determine if screening should resume. Some women have medical problems that increase the chance of getting cervical cancer. In these cases, your health care provider may recommend more frequent screening and Pap tests.  Colorectal Cancer  This type of cancer can be detected and often prevented.  Routine colorectal cancer screening usually begins at 51 years of age and continues through 51 years of age.  Your health care provider may recommend screening at an earlier age if you have risk factors  for colon cancer.  Your health care provider may also recommend using home test kits to check for hidden blood in the stool.  A small camera at the end of a tube can be used to examine your colon directly (sigmoidoscopy or colonoscopy). This is done to check for the earliest forms of colorectal cancer.  Routine screening usually begins at age 15.  Direct examination of the colon should be repeated every 5-10 years through 51 years of age. However, you may need to be screened more often if early forms of precancerous polyps or small growths are found.  Skin Cancer  Check your skin from head to toe regularly.  Tell your health care provider about any new moles or changes in moles, especially if there is a change in a mole's shape or color.  Also tell your health care provider if you have a mole that is larger than the size of a pencil eraser.  Always use sunscreen. Apply sunscreen  liberally and repeatedly throughout the day.  Protect yourself by wearing long sleeves, pants, a wide-brimmed hat, and sunglasses whenever you are outside.  Heart disease, diabetes, and high blood pressure  High blood pressure causes heart disease and increases the risk of stroke. High blood pressure is more likely to develop in: ? People who have blood pressure in the high end of the normal range (130-139/85-89 mm Hg). ? People who are overweight or obese. ? People who are African American.  If you are 32-98 years of age, have your blood pressure checked every 3-5 years. If you are 39 years of age or older, have your blood pressure checked every year. You should have your blood pressure measured twice-once when you are at a hospital or clinic, and once when you are not at a hospital or clinic. Record the average of the two measurements. To check your blood pressure when you are not at a hospital or clinic, you can use: ? An automated blood pressure machine at a pharmacy. ? A home blood pressure monitor.  If  you are between 37 years and 72 years old, ask your health care provider if you should take aspirin to prevent strokes.  Have regular diabetes screenings. This involves taking a blood sample to check your fasting blood sugar level. ? If you are at a normal weight and have a low risk for diabetes, have this test once every three years after 51 years of age. ? If you are overweight and have a high risk for diabetes, consider being tested at a younger age or more often. Preventing infection Hepatitis B  If you have a higher risk for hepatitis B, you should be screened for this virus. You are considered at high risk for hepatitis B if: ? You were born in a country where hepatitis B is common. Ask your health care provider which countries are considered high risk. ? Your parents were born in a high-risk country, and you have not been immunized against hepatitis B (hepatitis B vaccine). ? You have HIV or AIDS. ? You use needles to inject street drugs. ? You live with someone who has hepatitis B. ? You have had sex with someone who has hepatitis B. ? You get hemodialysis treatment. ? You take certain medicines for conditions, including cancer, organ transplantation, and autoimmune conditions.  Hepatitis C  Blood testing is recommended for: ? Everyone born from [birth year] through 1964. ? Anyone with known risk factors for hepatitis C.  Sexually transmitted infections (STIs)  You should be screened for sexually transmitted infections (STIs) including gonorrhea and chlamydia if: ? You are sexually active and are younger than 51 years of age. ? You are older than 51 years of age and your health care provider tells you that you are at risk for this type of infection. ? Your sexual activity has changed since you were last screened and you are at an increased risk for chlamydia or gonorrhea. Ask your health care provider if you are at risk.  If you do not have HIV, but are at risk, it may be recommended  that you take a prescription medicine daily to prevent HIV infection. This is called pre-exposure prophylaxis (PrEP). You are considered at risk if: ? You are sexually active and do not regularly use condoms or know the HIV status of your partner(s). ? You take drugs by injection. ? You are sexually active with a partner who has HIV.  Talk with your health care provider about  whether you are at high risk of being infected with HIV. If you choose to begin PrEP, you should first be tested for HIV. You should then be tested every 3 months for as long as you are taking PrEP. Pregnancy  If you are premenopausal and you may become pregnant, ask your health care provider about preconception counseling.  If you may become pregnant, take 400 to 800 micrograms (mcg) of folic acid every day.  If you want to prevent pregnancy, talk to your health care provider about birth control (contraception). Osteoporosis and menopause  Osteoporosis is a disease in which the bones lose minerals and strength with aging. This can result in serious bone fractures. Your risk for osteoporosis can be identified using a bone density scan.  If you are 43 years of age or older, or if you are at risk for osteoporosis and fractures, ask your health care provider if you should be screened.  Ask your health care provider whether you should take a calcium or vitamin D supplement to lower your risk for osteoporosis.  Menopause may have certain physical symptoms and risks.  Hormone replacement therapy may reduce some of these symptoms and risks. Talk to your health care provider about whether hormone replacement therapy is right for you. Follow these instructions at home:  Schedule regular health, dental, and eye exams.  Stay current with your immunizations.  Do not use any tobacco products including cigarettes, chewing tobacco, or electronic cigarettes.  If you are pregnant, do not drink alcohol.  If you are  breastfeeding, limit how much and how often you drink alcohol.  Limit alcohol intake to no more than 1 drink per day for nonpregnant women. One drink equals 12 ounces of beer, 5 ounces of wine, or 1 ounces of hard liquor.  Do not use street drugs.  Do not share needles.  Ask your health care provider for help if you need support or information about quitting drugs.  Tell your health care provider if you often feel depressed.  Tell your health care provider if you have ever been abused or do not feel safe at home. This information is not intended to replace advice given to you by your health care provider. Make sure you discuss any questions you have with your health care provider. Document Released: 03/23/2011 Document Revised: 02/13/2016 Document Reviewed: 06/11/2015 Elsevier Interactive Patient Education  [employer].

## 2017-11-19 ENCOUNTER — Telehealth: Payer: Self-pay | Admitting: Internal Medicine

## 2017-11-19 MED ORDER — TRIAMCINOLONE ACETONIDE 0.1 % EX CREA: 1.0000 "application " | TOPICAL_CREAM | Freq: Two times a day (BID) | CUTANEOUS | 3 refills | Status: DC

## 2017-11-19 NOTE — Telephone Encounter (Signed)
Apologies, this has been sent in.

## 2017-11-19 NOTE — Addendum Note (Signed)
Addended by: Hillard DankerRAWFORD, ELIZABETH A on: 11/19/2017 01:52 PM   Modules accepted: Orders

## 2017-11-19 NOTE — Telephone Encounter (Signed)
Copied from CRM [id number]. Topic: Quick Communication - Rx Refill/Question >> Nov 19, 2017 11:34 AM Maia Pettiesrtiz, Kristie S wrote: Medication: pt states a cream was supposed to be called in for a rash on her neck and pharmacy has not received. Please f/u with pt. Pt is going out of town this weekend and requesting it be sent in today. Has the patient contacted their pharmacy? Yes.   Preferred Pharmacy (with phone number or street name): [facility] [address], [place] - [address] [phone] (Phone) [phone] (Fax)

## 2017-11-19 NOTE — Telephone Encounter (Signed)
Notified pt MD sent rx to [facility] Kitchen.Redgie Grayer/lbm

## 2017-11-21 LAB — VARICELLA ZOSTER ANTIBODY, IGM: Varicella Zoster Ab IgM: 1.42 — ABNORMAL HIGH

## 2017-11-21 LAB — VARICELLA ZOSTER ANTIBODY, IGG

## 2017-11-23 ENCOUNTER — Other Ambulatory Visit: Payer: Self-pay | Admitting: General Surgery

## 2017-11-23 DIAGNOSIS — N6310 Unspecified lump in the right breast, unspecified quadrant: Secondary | ICD-10-CM

## 2017-12-17 ENCOUNTER — Ambulatory Visit
Admission: RE | Admit: 2017-12-17 | Discharge: 2017-12-17 | Disposition: A | Discharge status: Home / Self Care | Payer: Managed Care, Other (non HMO) | Source: Ambulatory Visit | Attending: General Surgery | Admitting: General Surgery

## 2017-12-17 ENCOUNTER — Other Ambulatory Visit (HOSPITAL_COMMUNITY): Payer: Self-pay | Admitting: Obstetrics and Gynecology

## 2017-12-17 DIAGNOSIS — R1031 Right lower quadrant pain: Secondary | ICD-10-CM

## 2017-12-17 DIAGNOSIS — N6310 Unspecified lump in the right breast, unspecified quadrant: Secondary | ICD-10-CM

## 2017-12-17 MED ORDER — GADOBENATE DIMEGLUMINE 529 MG/ML IV SOLN
10.0000 mL | Freq: Once | INTRAVENOUS | Status: AC | PRN
  Administered 2017-12-17: 10 mL via INTRAVENOUS

## 2017-12-18 ENCOUNTER — Other Ambulatory Visit: Payer: Managed Care, Other (non HMO)

## 2018-01-27 ENCOUNTER — Ambulatory Visit (HOSPITAL_COMMUNITY)
Admission: RE | Admit: 2018-01-27 | Discharge: 2018-01-27 | Disposition: A | Discharge status: Home / Self Care | Payer: Managed Care, Other (non HMO) | Source: Ambulatory Visit | Attending: Obstetrics and Gynecology | Admitting: Obstetrics and Gynecology

## 2018-01-27 ENCOUNTER — Ambulatory Visit (HOSPITAL_COMMUNITY): Payer: Managed Care, Other (non HMO)

## 2018-01-27 DIAGNOSIS — R1031 Right lower quadrant pain: Secondary | ICD-10-CM | POA: Insufficient documentation

## 2018-01-28 LAB — HM COLONOSCOPY

## 2018-02-02 ENCOUNTER — Encounter: Payer: Self-pay | Admitting: Internal Medicine

## 2018-02-02 NOTE — Progress Notes (Signed)
Abstracted and sent to scan  

## 2018-02-03 ENCOUNTER — Encounter: Payer: Self-pay | Admitting: Internal Medicine

## 2018-02-04 ENCOUNTER — Other Ambulatory Visit: Payer: Self-pay

## 2018-02-04 MED ORDER — DOXYCYCLINE HYCLATE 100 MG PO TABS: 100.0000 mg | ORAL_TABLET | Freq: Two times a day (BID) | ORAL | 0 refills | Status: DC

## 2018-02-04 NOTE — Telephone Encounter (Signed)
Sent in wrong pharmacy. Can you resend doxycycline rx to pharmacy patient listed above and then let patient know?

## 2018-06-23 IMAGING — US US PELVIS LIMITED
1 series · 14 of 14 positions shown · non-contrast
Comparison: None.

CLINICAL DATA: Right lower quadrant pain. Previous hysterectomy and
left salpingo oophorectomy.

EXAM:
LIMITED ULTRASOUND OF PELVIS
TECHNIQUE: Limited transabdominal ultrasound examination of the pelvis was
performed to visualized the right lower quadrant.

[Series 1: us pelvis limited · 14 acquisitions, 14 frames shown]
[im 1/14]
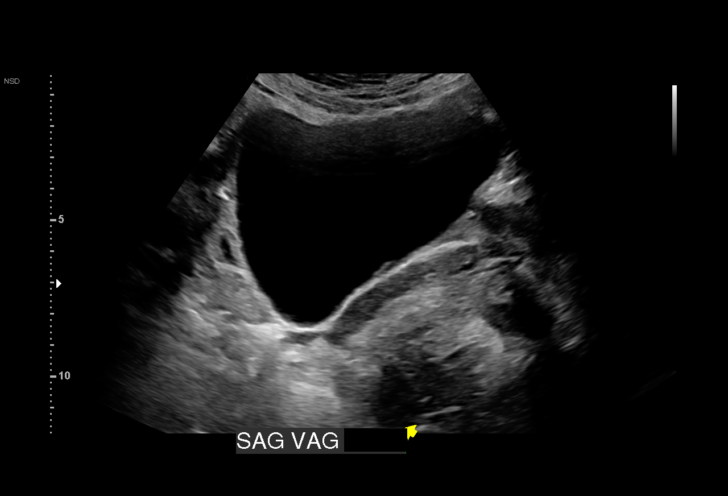
[im 2/14]
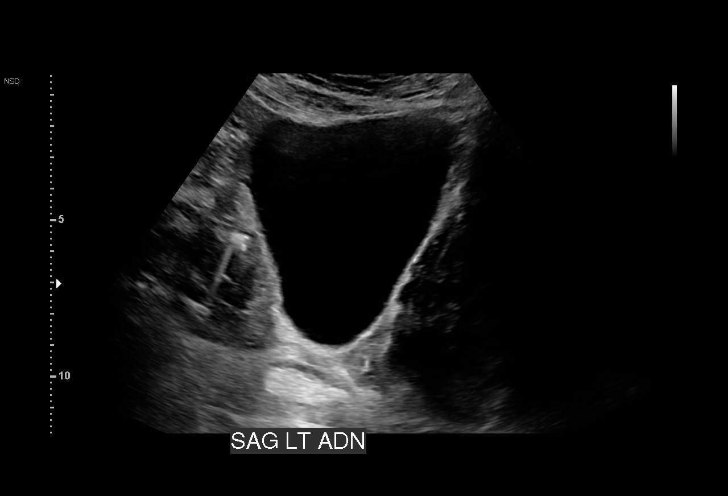
[im 3/14]
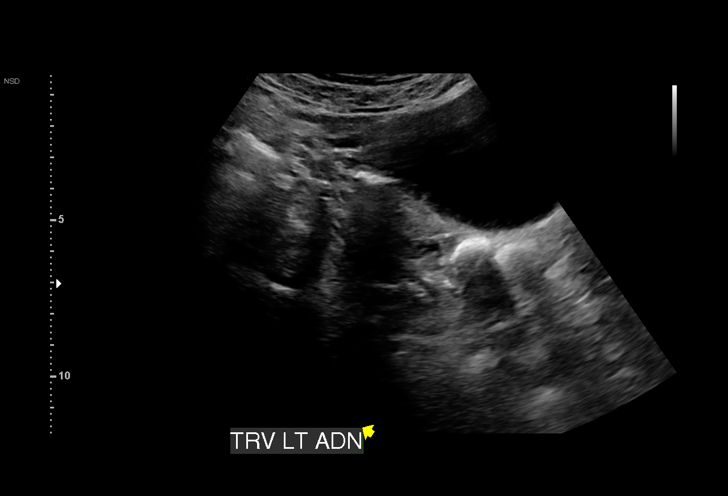
[im 4/14]
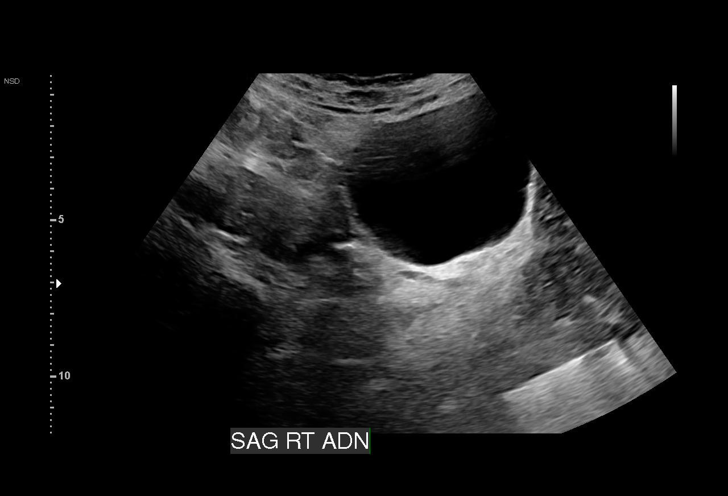
[im 5/14]
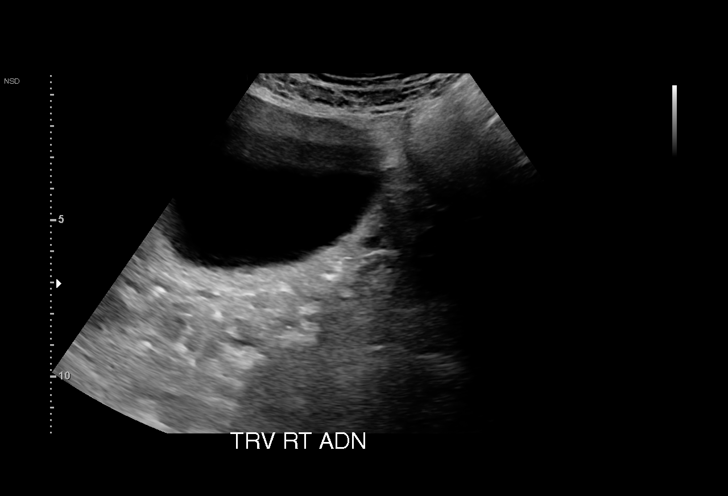
[im 6/14]
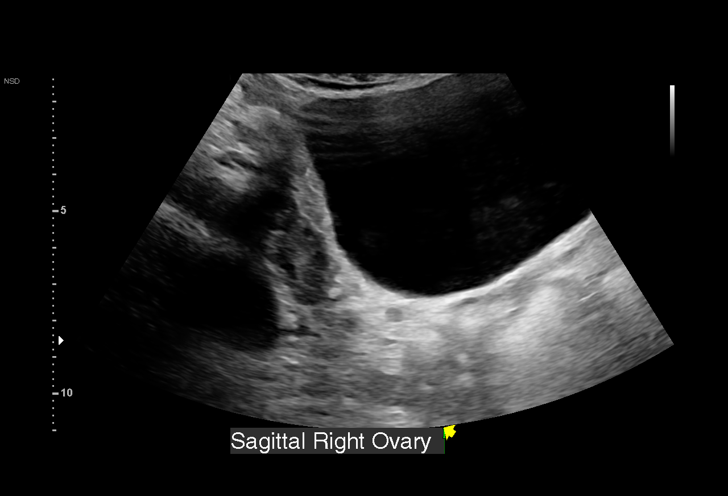
[im 7/14]
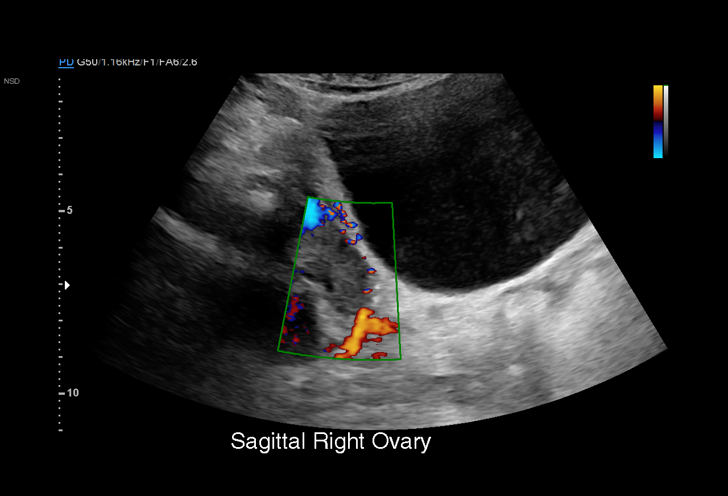
[im 8/14]
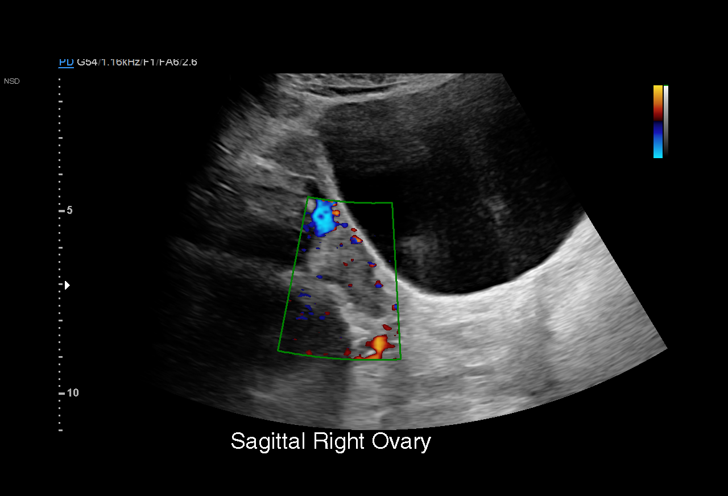
[im 9/14]
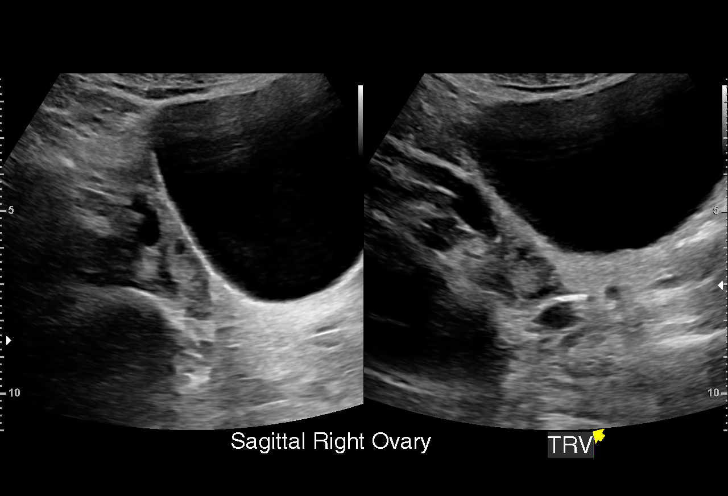
[im 10/14]
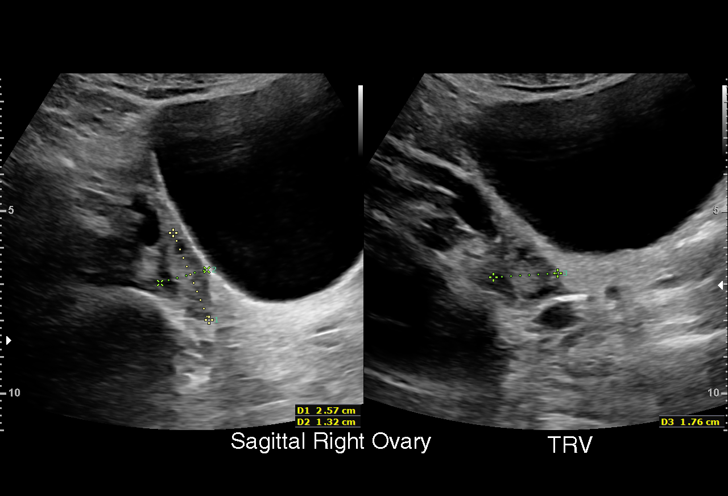
[im 11/14]
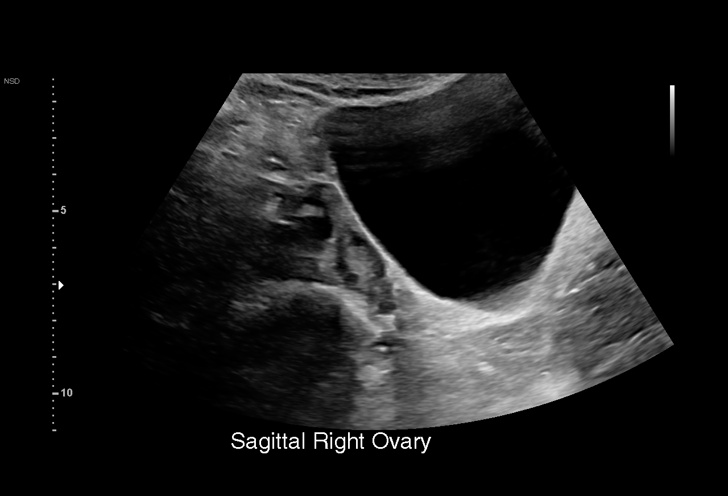
[im 12/14]
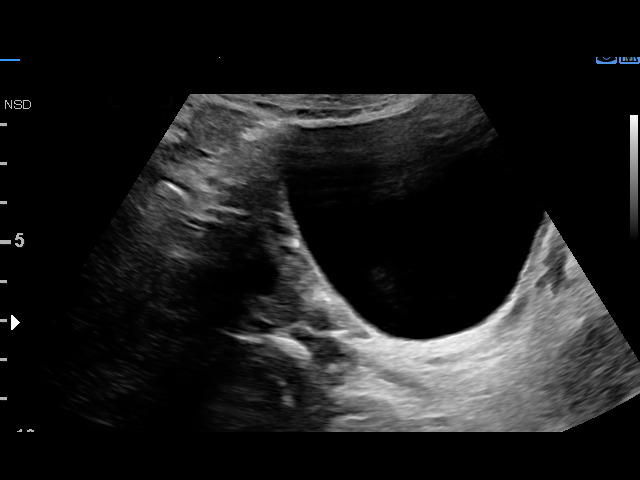
[im 13/14]
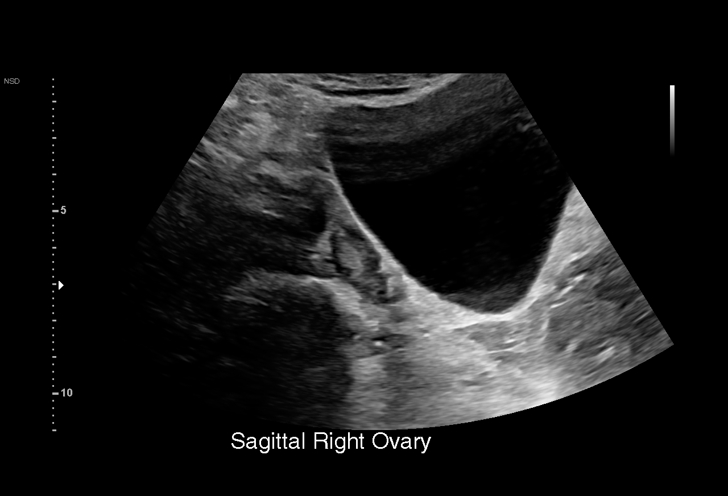
[im 14/14]
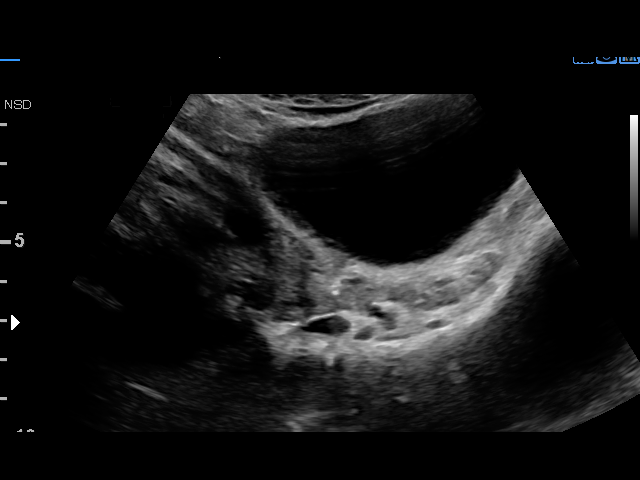

[14 of 14 positions shown; findings below may reference images not displayed]

FINDINGS: The right ovary is visualized which is normal in appearance. No soft
tissue mass or abnormal fluid collections seen in the right lower
quadrant.
IMPRESSION: Limited exam shows no sonographic abnormality in right lower
quadrant area.

## 2018-09-01 ENCOUNTER — Ambulatory Visit: Payer: Managed Care, Other (non HMO) | Admitting: Internal Medicine

## 2018-09-01 ENCOUNTER — Encounter: Payer: Self-pay | Admitting: Internal Medicine

## 2018-09-01 DIAGNOSIS — H938X2 Other specified disorders of left ear: Secondary | ICD-10-CM | POA: Diagnosis not present

## 2018-09-01 NOTE — Assessment & Plan Note (Signed)
Likely from allergic rhinitis and encouraged to continue flonase and claritin. Can use affrin prior to flight to avoid any sinus pressure.

## 2018-09-01 NOTE — Progress Notes (Signed)
   Subjective:    Patient ID: [name] [name], female    DOB: 1966-01-12, 52 y.o.   MRN: [id number]  HPI The patient is a 52 YO female coming in for ear pressure and allergies. Started about 1 month or so ago. She normally has allergies but these are worse than usual. Ear pressure worse left than right. Denies drainage. Denies fevers or chills. Taking claritin and flonase which helps sometimes but not lately. Is flying internationally soon and wants to make sure that this is okay. Stable since onset.   Review of Systems  Constitutional: Negative for activity change, appetite change, chills, fatigue, fever and unexpected weight change.  HENT: Positive for ear pain, postnasal drip and rhinorrhea. Negative for congestion, ear discharge, sinus pressure, sinus pain, sneezing, sore throat, tinnitus, trouble swallowing and voice change.   Eyes: Negative.   Respiratory: Positive for cough. Negative for chest tightness, shortness of breath and wheezing.   Cardiovascular: Negative.   Gastrointestinal: Negative.   Musculoskeletal: Negative.   Neurological: Negative.       Objective:   Physical Exam Constitutional:      Appearance: She is well-developed.  HENT:     Head: Normocephalic and atraumatic.     Ears:     Comments: TMs normal, ear canal normal Neck:     Musculoskeletal: Normal range of motion.     Thyroid: No thyromegaly.  Cardiovascular:     Rate and Rhythm: Normal rate and regular rhythm.  Pulmonary:     Effort: Pulmonary effort is normal. No respiratory distress.     Breath sounds: Normal breath sounds. No wheezing or rales.  Abdominal:     Palpations: Abdomen is soft.  Lymphadenopathy:     Cervical: No cervical adenopathy.  Skin:    General: Skin is warm and dry.  Neurological:     Mental Status: She is alert and oriented to person, place, and time.    Vitals:   09/01/18 1033  BP: 100/60  Pulse: 76  Temp: 98.5 F (36.9 C)  TempSrc: Oral  SpO2: 97%  Weight: 113 lb  (51.3 kg)  Height: 5\' 4"  (1.626 m)      Assessment & Plan:

## 2018-09-12 ENCOUNTER — Encounter: Payer: Self-pay | Admitting: Internal Medicine

## 2018-09-12 DIAGNOSIS — R059 Cough, unspecified: Secondary | ICD-10-CM

## 2018-09-12 DIAGNOSIS — R05 Cough: Secondary | ICD-10-CM

## 2018-09-19 ENCOUNTER — Encounter: Payer: Self-pay | Admitting: Internal Medicine

## 2018-09-19 ENCOUNTER — Ambulatory Visit (INDEPENDENT_AMBULATORY_CARE_PROVIDER_SITE_OTHER)
Admission: RE | Admit: 2018-09-19 | Discharge: 2018-09-19 | Disposition: A | Discharge status: Home / Self Care | Payer: Managed Care, Other (non HMO) | Source: Ambulatory Visit | Attending: Internal Medicine | Admitting: Internal Medicine

## 2018-09-19 DIAGNOSIS — R05 Cough: Secondary | ICD-10-CM

## 2018-09-19 DIAGNOSIS — R059 Cough, unspecified: Secondary | ICD-10-CM

## 2018-09-19 MED ORDER — BENZONATATE 200 MG PO CAPS: 200.0000 mg | ORAL_CAPSULE | Freq: Three times a day (TID) | ORAL | 0 refills | Status: DC | PRN

## 2018-11-11 ENCOUNTER — Other Ambulatory Visit: Payer: Self-pay | Admitting: Internal Medicine

## 2018-11-11 DIAGNOSIS — Z1231 Encounter for screening mammogram for malignant neoplasm of breast: Secondary | ICD-10-CM

## 2018-11-17 ENCOUNTER — Ambulatory Visit
Admission: RE | Admit: 2018-11-17 | Discharge: 2018-11-17 | Disposition: A | Discharge status: Home / Self Care | Payer: Managed Care, Other (non HMO) | Source: Ambulatory Visit

## 2018-11-17 DIAGNOSIS — Z1231 Encounter for screening mammogram for malignant neoplasm of breast: Secondary | ICD-10-CM

## 2018-11-18 ENCOUNTER — Ambulatory Visit (INDEPENDENT_AMBULATORY_CARE_PROVIDER_SITE_OTHER): Payer: Managed Care, Other (non HMO) | Admitting: Internal Medicine

## 2018-11-18 ENCOUNTER — Other Ambulatory Visit (INDEPENDENT_AMBULATORY_CARE_PROVIDER_SITE_OTHER): Payer: Managed Care, Other (non HMO)

## 2018-11-18 ENCOUNTER — Encounter: Payer: Self-pay | Admitting: Internal Medicine

## 2018-11-18 VITALS — BP 100/60 | HR 69 | Temp 98.2°F | Ht 64.0 in | Wt 113.0 lb

## 2018-11-18 DIAGNOSIS — Z0000 Encounter for general adult medical examination without abnormal findings: Secondary | ICD-10-CM

## 2018-11-18 LAB — COMPREHENSIVE METABOLIC PANEL
ALT: 15 U/L (ref 0–35)
AST: 18 U/L (ref 0–37)
Albumin: 4.4 g/dL (ref 3.5–5.2)
Alkaline Phosphatase: 48 U/L (ref 39–117)
BILIRUBIN TOTAL: 0.4 mg/dL (ref 0.2–1.2)
BUN: 13 mg/dL (ref 6–23)
CHLORIDE: 101 meq/L (ref 96–112)
CO2: 29 meq/L (ref 19–32)
Calcium: 9.3 mg/dL (ref 8.4–10.5)
Creatinine, Ser: 0.73 mg/dL (ref 0.40–1.20)
GFR: 83.91 mL/min (ref >60.00)
Glucose, Bld: 90 mg/dL (ref 70–99)
Potassium: 3.9 mEq/L (ref 3.5–5.1)
Sodium: 138 mEq/L (ref 135–145)
Total Protein: 7.5 g/dL (ref 6.0–8.3)

## 2018-11-18 LAB — CBC
HCT: 41 % (ref 36.0–46.0)
Hemoglobin: 13.7 g/dL (ref 12.0–15.0)
MCHC: 33.3 g/dL (ref 30.0–36.0)
MCV: 89.9 fl (ref 78.0–100.0)
PLATELETS: 280 10*3/uL (ref 150.0–400.0)
RBC: 4.56 Mil/uL (ref 3.87–5.11)
RDW: 13.1 % (ref 11.5–15.5)
WBC: 5.3 10*3/uL (ref 4.0–10.5)

## 2018-11-18 LAB — LIPID PANEL
CHOL/HDL RATIO: 3
Cholesterol: 225 mg/dL — ABNORMAL HIGH (ref 0–200)
HDL: 75.8 mg/dL (ref >39.00)
LDL CALC: 131 mg/dL — AB (ref 0–99)
NONHDL: 149.3
Triglycerides: 90 mg/dL (ref 0.0–149.0)
VLDL: 18 mg/dL (ref 0.0–40.0)

## 2018-11-18 LAB — TSH: TSH: 7.15 u[IU]/mL — ABNORMAL HIGH (ref 0.35–4.50)

## 2018-11-18 LAB — T4, FREE: Free T4: 0.85 ng/dL (ref 0.60–1.60)

## 2018-11-18 LAB — VITAMIN D 25 HYDROXY (VIT D DEFICIENCY, FRACTURES): VITD: 40.32 ng/mL (ref 30.00–100.00)

## 2018-11-18 LAB — HEMOGLOBIN A1C: HEMOGLOBIN A1C: 5.8 % (ref 4.6–6.5)

## 2018-11-18 NOTE — Patient Instructions (Signed)

## 2018-11-18 NOTE — Assessment & Plan Note (Signed)
Flu shot up to date. Shingrix just had chicken pox vaccines so declines for now due to low risk. Tetanus up to date. Colonoscopy up to date. Mammogram up to date, pap smear not indicated. Counseled about sun safety and mole surveillance. Counseled about the dangers of distracted driving. Given 10 year screening recommendations.

## 2018-11-18 NOTE — Progress Notes (Signed)
   Subjective:   Patient ID: [name] [name], female    DOB: 20-Apr-1966, 52 y.o.   MRN: [id number]  HPI The patient is a 53 YO female coming in for physical.   PMH, FMH, social history reviewed and updated  Review of Systems  Constitutional: Negative.   HENT: Negative.   Eyes: Negative.   Respiratory: Negative for cough, chest tightness and shortness of breath.   Cardiovascular: Negative for chest pain, palpitations and leg swelling.  Gastrointestinal: Negative for abdominal distention, abdominal pain, constipation, diarrhea, nausea and vomiting.  Musculoskeletal: Negative.   Skin: Negative.   Neurological: Negative.   Psychiatric/Behavioral: Negative.     Objective:  Physical Exam Constitutional:      Appearance: She is well-developed.  HENT:     Head: Normocephalic and atraumatic.  Neck:     Musculoskeletal: Normal range of motion.  Cardiovascular:     Rate and Rhythm: Normal rate and regular rhythm.  Pulmonary:     Effort: Pulmonary effort is normal. No respiratory distress.     Breath sounds: Normal breath sounds. No wheezing or rales.  Abdominal:     General: Bowel sounds are normal. There is no distension.     Palpations: Abdomen is soft.     Tenderness: There is no abdominal tenderness. There is no rebound.  Skin:    General: Skin is warm and dry.  Neurological:     Mental Status: She is alert and oriented to person, place, and time.     Coordination: Coordination normal.     Vitals:   11/18/18 0905  BP: 100/60  Pulse: 69  Temp: 98.2 F (36.8 C)  TempSrc: Oral  SpO2: 98%  Weight: 113 lb (51.3 kg)  Height: 5\' 4"  (1.626 m)    Assessment & Plan:

## 2019-02-16 ENCOUNTER — Telehealth: Payer: Self-pay | Admitting: Hematology and Oncology

## 2019-02-16 NOTE — Telephone Encounter (Signed)
Received a referral from Dr. Carolynne Edouard at [facility] for the high risk breast clinic. Pt has been cld and scheduled to see Dr. Pamelia Hoit on 6/1 at 315pm. Pt aware to arrive 15 minutes early.

## 2019-02-20 ENCOUNTER — Inpatient Hospital Stay: Payer: Managed Care, Other (non HMO) | Attending: Hematology and Oncology | Admitting: Hematology and Oncology

## 2019-02-20 ENCOUNTER — Encounter: Payer: Managed Care, Other (non HMO) | Admitting: Hematology and Oncology

## 2019-02-20 ENCOUNTER — Other Ambulatory Visit: Payer: Self-pay

## 2019-02-20 DIAGNOSIS — Z8249 Family history of ischemic heart disease and other diseases of the circulatory system: Secondary | ICD-10-CM | POA: Diagnosis not present

## 2019-02-20 DIAGNOSIS — G4700 Insomnia, unspecified: Secondary | ICD-10-CM | POA: Diagnosis not present

## 2019-02-20 DIAGNOSIS — Z1231 Encounter for screening mammogram for malignant neoplasm of breast: Secondary | ICD-10-CM

## 2019-02-20 DIAGNOSIS — Z1239 Encounter for other screening for malignant neoplasm of breast: Secondary | ICD-10-CM

## 2019-02-20 DIAGNOSIS — Z803 Family history of malignant neoplasm of breast: Secondary | ICD-10-CM | POA: Diagnosis not present

## 2019-02-20 NOTE — Progress Notes (Signed)
[facility] CONSULT NOTE  Patient Care Team: Myrlene Brokerrawford, Elizabeth A, MD as PCP - General (Internal Medicine) Silverio Layivard, Sandra, MD (Obstetrics and Gynecology) Charna ElizabethMann, Jyothi, MD (Gastroenterology) Blima LedgerMiller, Sally, OD (Optometry)  CHIEF COMPLAINTS/PURPOSE OF CONSULTATION:  High risk of breast cancer  HISTORY OF PRESENTING ILLNESS:  [name] [name] 53 y.o. female is here because of high risk of breast cancer.  Patient sister is a patient of mine who has breast cancer and is currently in remission.  She also has a aunt who had breast cancer age 52.  Because of this Dr. Carolynne Edouardoth was evaluating her and referred her to us for high risk clinic.  She had a breast MRI in 2019 which did not show any abnormality.  She had an screening mammogram February 2020 which was normal.  She however has high density breast with a category D.  She had felt her breast to be lumpy but has not had any specific nodules or masses.  She was referred to us for discussion regarding surveillance as well as risk reduction measures.  I reviewed her records extensively and collaborated the history with the patient.    MEDICAL HISTORY:  Past Medical History:  Diagnosis Date  . Endometriosis, site unspecified   . Gastritis   . History of measles   . History of mumps   . Insomnia, unspecified   . Internal hemorrhoids without mention of complication   . Lumbago   . Personal history of other diseases of digestive system   . Seizure, febrile (HCC)   . Tuberculin test reaction   . Vasovagal syncope     SURGICAL HISTORY: Past Surgical History:  Procedure Laterality Date  . LAPAROSCOPY  2006   for endometriosis with scar tissue  . ROBOTIC ASSISTED LAP VAGINAL HYSTERECTOMY  12/2008    SOCIAL HISTORY: Social History   Socioeconomic History  . Marital status: Single    Spouse name: Not on file  . Number of children: 0  . Years of education: 6216  . Highest education level: Not on file  Occupational History  .  Occupation: Nutritional therapistCustomer Service    Employer: [employer]    Employer: [employer]  Social Needs  . Financial resource strain: Not on file  . Food insecurity:    Worry: Not on file    Inability: Not on file  . Transportation needs:    Medical: Not on file    Non-medical: Not on file  Tobacco Use  . Smoking status: Never Smoker  . Smokeless tobacco: Never Used  Substance and Sexual Activity  . Alcohol use: No  . Drug use: No  . Sexual activity: Never    Birth control/protection: Surgical  Lifestyle  . Physical activity:    Days per week: Not on file    Minutes per session: Not on file  . Stress: Not on file  Relationships  . Social connections:    Talks on phone: Not on file    Gets together: Not on file    Attends religious service: Not on file    Active member of club or organization: Not on file    Attends meetings of clubs or organizations: Not on file    Relationship status: Not on file  . Intimate partner violence:    Fear of current or ex partner: Not on file    Emotionally abused: Not on file    Physically abused: Not on file    Forced sexual activity: Not on file  Other Topics Concern  .  Not on file  Social History Narrative   One of 4 children with a twin brother. HSG, BA-pscyh/counseling. Single; shares a house w/sister. Mother is here for an extended visit (Dec '13). 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  Extended family in [place] and [place]             FAMILY HISTORY: Family History  Problem Relation Age of Onset  . Esophageal cancer Father   . Schizophrenia Father   . Diabetes Father   . Arthritis Mother   . Coronary artery disease Mother   . Hypertension Mother   . Diabetes Mother   . Allergic rhinitis Sister   . Diabetes Sister   . Hyperlipidemia Sister   . Breast cancer Sister   . Breast cancer Maternal Aunt   . Colon cancer Paternal Aunt   . Asthma Brother     ALLERGIES:  is allergic to epinephrine and  penicillins.  MEDICATIONS:  Current Outpatient Medications  Medication Sig Dispense Refill  . Ascorbic Acid (VITAMIN C) 500 MG tablet Take 500 mg by mouth daily.      . Multiple Vitamins-Minerals (MULTIVITAMIN,TX-MINERALS) tablet Take 1 tablet by mouth daily.      . pantoprazole (PROTONIX) 20 MG tablet Take 20 mg by mouth daily.    Marland Kitchen triamcinolone cream (KENALOG) 0.1 % Apply 1 application topically 2 (two) times daily. 100 g 3  . vitamin E 400 UNIT capsule Take 400 Units by mouth daily.       No current facility-administered medications for this visit.     REVIEW OF SYSTEMS:   Constitutional: Denies fevers, chills or abnormal night sweats Eyes: Denies blurriness of vision, double vision or watery eyes Ears, nose, mouth, throat, and face: Denies mucositis or sore throat Respiratory: Denies cough, dyspnea or wheezes Cardiovascular: Denies palpitation, chest discomfort or lower extremity swelling Gastrointestinal:  Denies nausea, heartburn or change in bowel habits Skin: Denies abnormal skin rashes Lymphatics: Denies new lymphadenopathy or easy bruising Neurological:Denies numbness, tingling or new weaknesses Behavioral/Psych: Mood is stable, no new changes  Breast:  Denies any palpable lumps or discharge All other systems were reviewed with the patient and are negative.  PHYSICAL EXAMINATION: ECOG PERFORMANCE STATUS: 0 - Asymptomatic  Vitals:   02/20/19 1027  BP: 108/62  Pulse: 80  Resp: 20  Temp: 98.7 F (37.1 C)  SpO2: 100%   Filed Weights   02/20/19 1027  Weight: 113 lb 8 oz (51.5 kg)    Exam not performed because of COVID-19 precautions.  LABORATORY DATA:  I have reviewed the data as listed Lab Results  Component Value Date   WBC 5.3 11/18/2018   HGB 13.7 11/18/2018   HCT 41.0 11/18/2018   MCV 89.9 11/18/2018   PLT 280.0 11/18/2018   Lab Results  Component Value Date   NA 138 11/18/2018   K 3.9 11/18/2018   CL 101 11/18/2018   CO2 29 11/18/2018     RADIOGRAPHIC STUDIES: I have personally reviewed the radiological reports and agreed with the findings in the report.  ASSESSMENT AND PLAN:  Breast cancer screening, high risk patient Significant family history of breast cancer: Patient's sister who is also a patient of mine, her aunt both had breast cancers.  Tyer Cusick model: 24% risk of breast cancer Recommendation: 1.  Annual mammograms: Patient has high density breast with category D 2.  Breast MRIs: We discussed about annual versus every other year breast MRIs.  We determined that every other year is  adequate for surveillance. 3.  I did not recommend antiestrogen therapy for risk reduction.  We discussed nonpharmacological measures including exercise, fruits and vegetables, decreasing red meat, supplements like vitamin D and turmeric as possible ways to decrease personal risk of breast cancer.  Patient had prior hysterectomy as well as one ovary removal.  She will had previously taken oral contraceptive pills but no longer takes them.  I ordered the breast MRI to be done next year. 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  Her workplace is in [place].  She lives in [place] is spent time with her sister and mother.  She has been recently fourloughed because of COVID-19 related hotel disruption.  Return to clinic on an as-needed basis.    All questions were answered. The patient knows to call the clinic with any problems, questions or concerns.    Tamsen Meek, MD 02/20/19

## 2019-02-20 NOTE — Assessment & Plan Note (Signed)
Significant family history of breast cancer: Patient's sister who is also a patient of mine, her aunt both had breast cancers.  Tyer Cusick model: 24% risk of breast cancer Recommendation: 1.  Annual mammograms: Patient has high density breast with category D 2.  Breast MRIs: We discussed about annual versus every other year breast MRIs.  We determined that every other year is adequate for surveillance. 3.  I did not recommend antiestrogen therapy for risk reduction.  We discussed nonpharmacological measures including exercise, fruits and vegetables, decreasing red meat, supplements like vitamin D and turmeric as possible ways to decrease personal risk of breast cancer.  Patient had prior hysterectomy as well as one ovary removal.  She will had previously taken oral contraceptive pills but no longer takes them.  I ordered the breast MRI to be done next year. 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  Her workplace is in [place].  She lives in [place] is spent time with her sister and mother.  She has been recently fourloughed because of COVID-19 related hotel disruption.  Return to clinic on an as-needed basis.

## 2019-10-05 ENCOUNTER — Encounter: Payer: Self-pay | Admitting: Internal Medicine

## 2019-11-17 ENCOUNTER — Other Ambulatory Visit: Payer: Self-pay | Admitting: Internal Medicine

## 2019-11-17 DIAGNOSIS — Z1231 Encounter for screening mammogram for malignant neoplasm of breast: Secondary | ICD-10-CM

## 2019-11-20 ENCOUNTER — Ambulatory Visit
Admission: RE | Admit: 2019-11-20 | Discharge: 2019-11-20 | Disposition: A | Discharge status: Home / Self Care | Payer: Managed Care, Other (non HMO) | Source: Ambulatory Visit

## 2019-11-20 ENCOUNTER — Other Ambulatory Visit: Payer: Self-pay

## 2019-11-20 DIAGNOSIS — Z1231 Encounter for screening mammogram for malignant neoplasm of breast: Secondary | ICD-10-CM

## 2019-11-21 ENCOUNTER — Encounter: Payer: Self-pay | Admitting: Internal Medicine

## 2019-11-21 ENCOUNTER — Encounter: Payer: Managed Care, Other (non HMO) | Admitting: Internal Medicine

## 2019-11-21 ENCOUNTER — Ambulatory Visit (INDEPENDENT_AMBULATORY_CARE_PROVIDER_SITE_OTHER): Payer: Managed Care, Other (non HMO) | Admitting: Internal Medicine

## 2019-11-21 VITALS — BP 116/74 | HR 86 | Temp 97.7°F | Ht 64.0 in | Wt 110.0 lb

## 2019-11-21 DIAGNOSIS — K219 Gastro-esophageal reflux disease without esophagitis: Secondary | ICD-10-CM | POA: Diagnosis not present

## 2019-11-21 DIAGNOSIS — Z1239 Encounter for other screening for malignant neoplasm of breast: Secondary | ICD-10-CM

## 2019-11-21 DIAGNOSIS — Z0000 Encounter for general adult medical examination without abnormal findings: Secondary | ICD-10-CM | POA: Diagnosis not present

## 2019-11-21 LAB — LIPID PANEL
Cholesterol: 234 mg/dL — ABNORMAL HIGH (ref 0–200)
HDL: 72.9 mg/dL (ref >39.00)
LDL Cholesterol: 142 mg/dL — ABNORMAL HIGH (ref 0–99)
NonHDL: 160.61
Total CHOL/HDL Ratio: 3
Triglycerides: 94 mg/dL (ref 0.0–149.0)
VLDL: 18.8 mg/dL (ref 0.0–40.0)

## 2019-11-21 LAB — HEMOGLOBIN A1C: Hgb A1c MFr Bld: 5.8 % (ref 4.6–6.5)

## 2019-11-21 LAB — COMPREHENSIVE METABOLIC PANEL
ALT: 11 U/L (ref 0–35)
AST: 16 U/L (ref 0–37)
Albumin: 4.1 g/dL (ref 3.5–5.2)
Alkaline Phosphatase: 46 U/L (ref 39–117)
BUN: 18 mg/dL (ref 6–23)
CO2: 29 mEq/L (ref 19–32)
Calcium: 9.4 mg/dL (ref 8.4–10.5)
Chloride: 102 mEq/L (ref 96–112)
Creatinine, Ser: 0.8 mg/dL (ref 0.40–1.20)
GFR: 75.2 mL/min (ref >60.00)
Glucose, Bld: 96 mg/dL (ref 70–99)
Potassium: 3.8 mEq/L (ref 3.5–5.1)
Sodium: 139 mEq/L (ref 135–145)
Total Bilirubin: 0.5 mg/dL (ref 0.2–1.2)
Total Protein: 7.5 g/dL (ref 6.0–8.3)

## 2019-11-21 LAB — CBC
HCT: 38.1 % (ref 36.0–46.0)
Hemoglobin: 12.9 g/dL (ref 12.0–15.0)
MCHC: 33.8 g/dL (ref 30.0–36.0)
MCV: 89.9 fl (ref 78.0–100.0)
Platelets: 273 10*3/uL (ref 150.0–400.0)
RBC: 4.23 Mil/uL (ref 3.87–5.11)
RDW: 12.9 % (ref 11.5–15.5)
WBC: 5.5 10*3/uL (ref 4.0–10.5)

## 2019-11-21 LAB — TSH: TSH: 13.62 u[IU]/mL — ABNORMAL HIGH (ref 0.35–4.50)

## 2019-11-21 LAB — VITAMIN D 25 HYDROXY (VIT D DEFICIENCY, FRACTURES): VITD: 48.96 ng/mL (ref 30.00–100.00)

## 2019-11-21 NOTE — Assessment & Plan Note (Signed)
Flu shot up to date. Shingrix counseled. Tetanus up to date. Colonoscopy up to date. Mammogram up to date, pap smear up to date. Counseled about sun safety and mole surveillance. Counseled about the dangers of distracted driving. Given 10 year screening recommendations.   

## 2019-11-21 NOTE — Assessment & Plan Note (Signed)
Recent mammogram normal and getting MRI this weekend. MRI every other year for additional screening.

## 2019-11-21 NOTE — Assessment & Plan Note (Signed)
Takes pepcid daily to BID and uses protonix only rarely.

## 2019-11-21 NOTE — Patient Instructions (Signed)
Health Maintenance, Female Adopting a healthy lifestyle and getting preventive care are important in promoting health and wellness. Ask your health care provider about:  The right schedule for you to have regular tests and exams.  Things you can do on your own to prevent diseases and keep yourself healthy. What should I know about diet, weight, and exercise? Eat a healthy diet   Eat a diet that includes plenty of vegetables, fruits, low-fat dairy products, and lean protein.  Do not eat a lot of foods that are high in solid fats, added sugars, or sodium. Maintain a healthy weight Body mass index (BMI) is used to identify weight problems. It estimates body fat based on height and weight. Your health care provider can help determine your BMI and help you achieve or maintain a healthy weight. Get regular exercise Get regular exercise. This is one of the most important things you can do for your health. Most adults should:  Exercise for at least 150 minutes each week. The exercise should increase your heart rate and make you sweat (moderate-intensity exercise).  Do strengthening exercises at least twice a week. This is in addition to the moderate-intensity exercise.  Spend less time sitting. Even light physical activity can be beneficial. Watch cholesterol and blood lipids Have your blood tested for lipids and cholesterol at 53 years of age, then have this test every 5 years. Have your cholesterol levels checked more often if:  Your lipid or cholesterol levels are high.  You are older than 53 years of age.  You are at high risk for heart disease. What should I know about cancer screening? Depending on your health history and family history, you may need to have cancer screening at various ages. This may include screening for:  Breast cancer.  Cervical cancer.  Colorectal cancer.  Skin cancer.  Lung cancer. What should I know about heart disease, diabetes, and high blood  pressure? Blood pressure and heart disease  High blood pressure causes heart disease and increases the risk of stroke. This is more likely to develop in people who have high blood pressure readings, are of African descent, or are overweight.  Have your blood pressure checked: ? Every 3-5 years if you are 18-39 years of age. ? Every year if you are 40 years old or older. Diabetes Have regular diabetes screenings. This checks your fasting blood sugar level. Have the screening done:  Once every three years after age 40 if you are at a normal weight and have a low risk for diabetes.  More often and at a younger age if you are overweight or have a high risk for diabetes. What should I know about preventing infection? Hepatitis B If you have a higher risk for hepatitis B, you should be screened for this virus. Talk with your health care provider to find out if you are at risk for hepatitis B infection. Hepatitis C Testing is recommended for:  Everyone born from 1945 through 1965.  Anyone with known risk factors for hepatitis C. Sexually transmitted infections (STIs)  Get screened for STIs, including gonorrhea and chlamydia, if: ? You are sexually active and are younger than 53 years of age. ? You are older than 53 years of age and your health care provider tells you that you are at risk for this type of infection. ? Your sexual activity has changed since you were last screened, and you are at increased risk for chlamydia or gonorrhea. Ask your health care provider if   you are at risk.  Ask your health care provider about whether you are at high risk for HIV. Your health care provider may recommend a prescription medicine to help prevent HIV infection. If you choose to take medicine to prevent HIV, you should first get tested for HIV. You should then be tested every 3 months for as long as you are taking the medicine. Pregnancy  If you are about to stop having your period (premenopausal) and  you may become pregnant, seek counseling before you get pregnant.  Take 400 to 800 micrograms (mcg) of folic acid every day if you become pregnant.  Ask for birth control (contraception) if you want to prevent pregnancy. Osteoporosis and menopause Osteoporosis is a disease in which the bones lose minerals and strength with aging. This can result in bone fractures. If you are 65 years old or older, or if you are at risk for osteoporosis and fractures, ask your health care provider if you should:  Be screened for bone loss.  Take a calcium or vitamin D supplement to lower your risk of fractures.  Be given hormone replacement therapy (HRT) to treat symptoms of menopause. Follow these instructions at home: Lifestyle  Do not use any products that contain nicotine or tobacco, such as cigarettes, e-cigarettes, and chewing tobacco. If you need help quitting, ask your health care provider.  Do not use street drugs.  Do not share needles.  Ask your health care provider for help if you need support or information about quitting drugs. Alcohol use  Do not drink alcohol if: ? Your health care provider tells you not to drink. ? You are pregnant, may be pregnant, or are planning to become pregnant.  If you drink alcohol: ? Limit how much you use to 0-1 drink a day. ? Limit intake if you are breastfeeding.  Be aware of how much alcohol is in your drink. In the [place] one drink equals one 12 oz bottle of beer (355 mL), one 5 oz glass of wine (148 mL), or one 1 oz glass of hard liquor (44 mL). General instructions  Schedule regular health, dental, and eye exams.  Stay current with your vaccines.  Tell your health care provider if: ? You often feel depressed. ? You have ever been abused or do not feel safe at home. Summary  Adopting a healthy lifestyle and getting preventive care are important in promoting health and wellness.  Follow your health care provider's instructions about healthy  diet, exercising, and getting tested or screened for diseases.  Follow your health care provider's instructions on monitoring your cholesterol and blood pressure. This information is not intended to replace advice given to you by your health care provider. Make sure you discuss any questions you have with your health care provider. Document Revised: 08/31/2018 Document Reviewed: 08/31/2018 Elsevier Patient Education  2020 Elsevier Inc.  

## 2019-11-21 NOTE — Progress Notes (Signed)
   Subjective:   Patient ID: [name] [name], female    DOB: 1965-11-14, 54 y.o.   MRN: [id number]  HPI The patient is a 54 YO female coming in for physical. Overall doing well, some pain in low back lasts 2-3 days with flare.   PMH, [facility], social history reviewed and updated   Review of Systems  Constitutional: Negative.   HENT: Negative.   Eyes: Negative.   Respiratory: Negative for cough, chest tightness and shortness of breath.   Cardiovascular: Negative for chest pain, palpitations and leg swelling.  Gastrointestinal: Negative for abdominal distention, abdominal pain, constipation, diarrhea, nausea and vomiting.  Musculoskeletal: Positive for back pain.  Skin: Negative.   Neurological: Negative.   Psychiatric/Behavioral: Negative.     Objective:  Physical Exam Constitutional:      Appearance: She is well-developed.  HENT:     Head: Normocephalic and atraumatic.  Cardiovascular:     Rate and Rhythm: Normal rate and regular rhythm.  Pulmonary:     Effort: Pulmonary effort is normal. No respiratory distress.     Breath sounds: Normal breath sounds. No wheezing or rales.  Abdominal:     General: Bowel sounds are normal. There is no distension.     Palpations: Abdomen is soft.     Tenderness: There is no abdominal tenderness. There is no rebound.  Musculoskeletal:        General: No tenderness.     Cervical back: Normal range of motion.  Skin:    General: Skin is warm and dry.  Neurological:     Mental Status: She is alert and oriented to person, place, and time.     Coordination: Coordination normal.     Vitals:   11/21/19 0824  BP: 116/74  Pulse: 86  Temp: 97.7 F (36.5 C)  TempSrc: Oral  SpO2: 93%  Weight: 110 lb (49.9 kg)  Height: 5\' 4"  (1.626 m)    This visit occurred during the SARS-CoV-2 public health emergency.  Safety protocols were in place, including screening questions prior to the visit, additional usage of staff PPE, and extensive cleaning of  exam room while observing appropriate contact time as indicated for disinfecting solutions.   Assessment & Plan:

## 2019-11-22 ENCOUNTER — Encounter: Payer: Self-pay | Admitting: Internal Medicine

## 2019-11-22 ENCOUNTER — Encounter: Payer: Managed Care, Other (non HMO) | Admitting: Internal Medicine

## 2019-11-22 ENCOUNTER — Telehealth: Payer: Self-pay

## 2019-11-22 DIAGNOSIS — R7989 Other specified abnormal findings of blood chemistry: Secondary | ICD-10-CM

## 2019-11-22 NOTE — Telephone Encounter (Signed)
New message    The patient calling for Dr. Okey Dupre has questions regarding blood work - Thyroid level.

## 2019-11-23 ENCOUNTER — Other Ambulatory Visit (INDEPENDENT_AMBULATORY_CARE_PROVIDER_SITE_OTHER): Payer: Managed Care, Other (non HMO)

## 2019-11-23 ENCOUNTER — Other Ambulatory Visit: Payer: Self-pay | Admitting: Internal Medicine

## 2019-11-23 DIAGNOSIS — R7989 Other specified abnormal findings of blood chemistry: Secondary | ICD-10-CM

## 2019-11-23 LAB — T4, FREE: Free T4: 0.81 ng/dL (ref 0.60–1.60)

## 2019-11-23 NOTE — Telephone Encounter (Signed)
Once lab work has been reviewed with notations from PCP pt will receive a call.

## 2019-11-25 ENCOUNTER — Other Ambulatory Visit: Payer: Self-pay

## 2019-11-25 ENCOUNTER — Ambulatory Visit
Admission: RE | Admit: 2019-11-25 | Discharge: 2019-11-25 | Disposition: A | Discharge status: Home / Self Care | Payer: Managed Care, Other (non HMO) | Source: Ambulatory Visit | Attending: Hematology and Oncology | Admitting: Hematology and Oncology

## 2019-11-25 DIAGNOSIS — Z1231 Encounter for screening mammogram for malignant neoplasm of breast: Secondary | ICD-10-CM

## 2019-11-25 MED ORDER — GADOBUTROL 1 MMOL/ML IV SOLN
5.0000 mL | Freq: Once | INTRAVENOUS | Status: AC | PRN
Start: 2019-11-25 — End: 2019-11-25
  Administered 2019-11-25: 10:00:00 5 mL via INTRAVENOUS

## 2019-11-27 ENCOUNTER — Telehealth: Payer: Self-pay | Admitting: Hematology and Oncology

## 2019-11-27 NOTE — Telephone Encounter (Signed)
I informed the patient of the breast MRI was normal.  She had already seen it on her MyChart.

## 2020-01-10 ENCOUNTER — Encounter: Payer: Self-pay | Admitting: Internal Medicine

## 2020-01-11 MED ORDER — ACYCLOVIR 5 % EX OINT: 1.0000 "application " | TOPICAL_OINTMENT | Freq: Three times a day (TID) | CUTANEOUS | 2 refills | Status: AC

## 2020-02-18 ENCOUNTER — Encounter: Payer: Self-pay | Admitting: Internal Medicine

## 2020-02-28 ENCOUNTER — Other Ambulatory Visit (INDEPENDENT_AMBULATORY_CARE_PROVIDER_SITE_OTHER): Payer: Managed Care, Other (non HMO)

## 2020-02-28 DIAGNOSIS — R7989 Other specified abnormal findings of blood chemistry: Secondary | ICD-10-CM | POA: Diagnosis not present

## 2020-02-28 LAB — T4, FREE: Free T4: 0.75 ng/dL (ref 0.60–1.60)

## 2020-02-28 LAB — TSH: TSH: 8.82 u[IU]/mL — ABNORMAL HIGH (ref 0.35–4.50)

## 2020-03-08 ENCOUNTER — Encounter: Payer: Self-pay | Admitting: Internal Medicine

## 2020-03-08 MED ORDER — MUPIROCIN 2 % EX OINT: 1.0000 "application " | TOPICAL_OINTMENT | Freq: Two times a day (BID) | CUTANEOUS | 0 refills | Status: DC

## 2020-06-23 ENCOUNTER — Encounter: Payer: Self-pay | Admitting: Internal Medicine

## 2020-06-25 ENCOUNTER — Other Ambulatory Visit: Payer: Self-pay | Admitting: Family

## 2020-10-22 ENCOUNTER — Other Ambulatory Visit: Payer: Self-pay | Admitting: Internal Medicine

## 2020-10-22 DIAGNOSIS — Z1231 Encounter for screening mammogram for malignant neoplasm of breast: Secondary | ICD-10-CM

## 2020-10-31 ENCOUNTER — Other Ambulatory Visit: Payer: Self-pay | Admitting: General Surgery

## 2020-10-31 DIAGNOSIS — Z1239 Encounter for other screening for malignant neoplasm of breast: Secondary | ICD-10-CM

## 2020-11-08 ENCOUNTER — Other Ambulatory Visit: Payer: Self-pay

## 2020-11-08 ENCOUNTER — Encounter: Payer: Self-pay | Admitting: Internal Medicine

## 2020-11-08 MED ORDER — PANTOPRAZOLE SODIUM 20 MG PO TBEC: 20.0000 mg | DELAYED_RELEASE_TABLET | Freq: Every day | ORAL | 3 refills | Status: DC

## 2020-11-19 ENCOUNTER — Encounter: Payer: Self-pay | Admitting: Internal Medicine

## 2020-11-19 ENCOUNTER — Other Ambulatory Visit: Payer: Self-pay

## 2020-11-19 ENCOUNTER — Ambulatory Visit (INDEPENDENT_AMBULATORY_CARE_PROVIDER_SITE_OTHER): Payer: Managed Care, Other (non HMO) | Admitting: Internal Medicine

## 2020-11-19 VITALS — BP 102/70 | HR 81 | Temp 98.2°F | Resp 18 | Ht 64.0 in | Wt 101.6 lb

## 2020-11-19 DIAGNOSIS — K8020 Calculus of gallbladder without cholecystitis without obstruction: Secondary | ICD-10-CM

## 2020-11-19 DIAGNOSIS — Z0000 Encounter for general adult medical examination without abnormal findings: Secondary | ICD-10-CM | POA: Diagnosis not present

## 2020-11-19 DIAGNOSIS — K219 Gastro-esophageal reflux disease without esophagitis: Secondary | ICD-10-CM

## 2020-11-19 LAB — CBC
HCT: 40.2 % (ref 36.0–46.0)
Hemoglobin: 13.4 g/dL (ref 12.0–15.0)
MCHC: 33.3 g/dL (ref 30.0–36.0)
MCV: 89.2 fl (ref 78.0–100.0)
Platelets: 286 10*3/uL (ref 150.0–400.0)
RBC: 4.51 Mil/uL (ref 3.87–5.11)
RDW: 12.6 % (ref 11.5–15.5)
WBC: 5.2 10*3/uL (ref 4.0–10.5)

## 2020-11-19 LAB — COMPREHENSIVE METABOLIC PANEL
ALT: 13 U/L (ref 0–35)
AST: 17 U/L (ref 0–37)
Albumin: 4.4 g/dL (ref 3.5–5.2)
Alkaline Phosphatase: 49 U/L (ref 39–117)
BUN: 16 mg/dL (ref 6–23)
CO2: 29 mEq/L (ref 19–32)
Calcium: 9.5 mg/dL (ref 8.4–10.5)
Chloride: 100 mEq/L (ref 96–112)
Creatinine, Ser: 0.83 mg/dL (ref 0.40–1.20)
GFR: 80.48 mL/min (ref >60.00)
Glucose, Bld: 87 mg/dL (ref 70–99)
Potassium: 4.2 mEq/L (ref 3.5–5.1)
Sodium: 137 mEq/L (ref 135–145)
Total Bilirubin: 0.5 mg/dL (ref 0.2–1.2)
Total Protein: 7.7 g/dL (ref 6.0–8.3)

## 2020-11-19 LAB — VITAMIN B12: Vitamin B-12: 735 pg/mL (ref 211–911)

## 2020-11-19 LAB — TSH: TSH: 8.56 u[IU]/mL — ABNORMAL HIGH (ref 0.35–4.50)

## 2020-11-19 LAB — LIPID PANEL
Cholesterol: 232 mg/dL — ABNORMAL HIGH (ref 0–200)
HDL: 73.1 mg/dL (ref >39.00)
LDL Cholesterol: 144 mg/dL — ABNORMAL HIGH (ref 0–99)
NonHDL: 158.92
Total CHOL/HDL Ratio: 3
Triglycerides: 77 mg/dL (ref 0.0–149.0)
VLDL: 15.4 mg/dL (ref 0.0–40.0)

## 2020-11-19 LAB — VITAMIN D 25 HYDROXY (VIT D DEFICIENCY, FRACTURES): VITD: 57.58 ng/mL (ref 30.00–100.00)

## 2020-11-19 LAB — T4, FREE: Free T4: 0.77 ng/dL (ref 0.60–1.60)

## 2020-11-19 NOTE — Assessment & Plan Note (Signed)
Takes protonix and pepcid with good relief.

## 2020-11-19 NOTE — Patient Instructions (Addendum)
Let us know if the weight continues to drop.   Health Maintenance, Female Adopting a healthy lifestyle and getting preventive care are important in promoting health and wellness. Ask your health care provider about:  The right schedule for you to have regular tests and exams.  Things you can do on your own to prevent diseases and keep yourself healthy. What should I know about diet, weight, and exercise? Eat a healthy diet  Eat a diet that includes plenty of vegetables, fruits, low-fat dairy products, and lean protein.  Do not eat a lot of foods that are high in solid fats, added sugars, or sodium.   Maintain a healthy weight Body mass index (BMI) is used to identify weight problems. It estimates body fat based on height and weight. Your health care provider can help determine your BMI and help you achieve or maintain a healthy weight. Get regular exercise Get regular exercise. This is one of the most important things you can do for your health. Most adults should:  Exercise for at least 150 minutes each week. The exercise should increase your heart rate and make you sweat (moderate-intensity exercise).  Do strengthening exercises at least twice a week. This is in addition to the moderate-intensity exercise.  Spend less time sitting. Even light physical activity can be beneficial. Watch cholesterol and blood lipids Have your blood tested for lipids and cholesterol at 54 years of age, then have this test every 5 years. Have your cholesterol levels checked more often if:  Your lipid or cholesterol levels are high.  You are older than 54 years of age.  You are at high risk for heart disease. What should I know about cancer screening? Depending on your health history and family history, you may need to have cancer screening at various ages. This may include screening for:  Breast cancer.  Cervical cancer.  Colorectal cancer.  Skin cancer.  Lung cancer. What should I know  about heart disease, diabetes, and high blood pressure? Blood pressure and heart disease  High blood pressure causes heart disease and increases the risk of stroke. This is more likely to develop in people who have high blood pressure readings, are of African descent, or are overweight.  Have your blood pressure checked: ? Every 3-5 years if you are 24-23 years of age. ? Every year if you are 50 years old or older. Diabetes Have regular diabetes screenings. This checks your fasting blood sugar level. Have the screening done:  Once every three years after age 18 if you are at a normal weight and have a low risk for diabetes.  More often and at a younger age if you are overweight or have a high risk for diabetes. What should I know about preventing infection? Hepatitis B If you have a higher risk for hepatitis B, you should be screened for this virus. Talk with your health care provider to find out if you are at risk for hepatitis B infection. Hepatitis C Testing is recommended for:  Everyone born from [birth year] through 1964.  Anyone with known risk factors for hepatitis C. Sexually transmitted infections (STIs)  Get screened for STIs, including gonorrhea and chlamydia, if: ? You are sexually active and are younger than 54 years of age. ? You are older than 54 years of age and your health care provider tells you that you are at risk for this type of infection. ? Your sexual activity has changed since you were last screened, and you are at  increased risk for chlamydia or gonorrhea. Ask your health care provider if you are at risk.  Ask your health care provider about whether you are at high risk for HIV. Your health care provider may recommend a prescription medicine to help prevent HIV infection. If you choose to take medicine to prevent HIV, you should first get tested for HIV. You should then be tested every 3 months for as long as you are taking the medicine. Pregnancy  If you are about  to stop having your period (premenopausal) and you may become pregnant, seek counseling before you get pregnant.  Take 400 to 800 micrograms (mcg) of folic acid every day if you become pregnant.  Ask for birth control (contraception) if you want to prevent pregnancy. Osteoporosis and menopause Osteoporosis is a disease in which the bones lose minerals and strength with aging. This can result in bone fractures. If you are 51 years old or older, or if you are at risk for osteoporosis and fractures, ask your health care provider if you should:  Be screened for bone loss.  Take a calcium or vitamin D supplement to lower your risk of fractures.  Be given hormone replacement therapy (HRT) to treat symptoms of menopause. Follow these instructions at home: Lifestyle  Do not use any products that contain nicotine or tobacco, such as cigarettes, e-cigarettes, and chewing tobacco. If you need help quitting, ask your health care provider.  Do not use street drugs.  Do not share needles.  Ask your health care provider for help if you need support or information about quitting drugs. Alcohol use  Do not drink alcohol if: ? Your health care provider tells you not to drink. ? You are pregnant, may be pregnant, or are planning to become pregnant.  If you drink alcohol: ? Limit how much you use to 0-1 drink a day. ? Limit intake if you are breastfeeding.  Be aware of how much alcohol is in your drink. In the [place] one drink equals one 12 oz bottle of beer (355 mL), one 5 oz glass of wine (148 mL), or one 1 oz glass of hard liquor (44 mL). General instructions  Schedule regular health, dental, and eye exams.  Stay current with your vaccines.  Tell your health care provider if: ? You often feel depressed. ? You have ever been abused or do not feel safe at home. Summary  Adopting a healthy lifestyle and getting preventive care are important in promoting health and wellness.  Follow your  health care provider's instructions about healthy diet, exercising, and getting tested or screened for diseases.  Follow your health care provider's instructions on monitoring your cholesterol and blood pressure. This information is not intended to replace advice given to you by your health care provider. Make sure you discuss any questions you have with your health care provider. Document Revised: 08/31/2018 Document Reviewed: 08/31/2018 Elsevier Patient Education  2021 [employer].

## 2020-11-19 NOTE — Assessment & Plan Note (Signed)
Having some intermittent pains and has been changing diet to help. Had some weight loss associated but this has stabilized in recent months.

## 2020-11-19 NOTE — Assessment & Plan Note (Signed)
Flu shot declines. Covid-19 declines. Shingrix declines. Tetanus up to date. Colonoscopy up to date. Mammogram up to date, pap smear not indicated. Counseled about sun safety and mole surveillance. Counseled about the dangers of distracted driving. Given 10 year screening recommendations.

## 2020-11-19 NOTE — Progress Notes (Signed)
   Subjective:   Patient ID: [name] [name], female    DOB: 06/05/1966, 55 y.o.   MRN: [id number]  HPI The patient is a 55 YO female coming in for physical.   PMH, FMH, social history reviewed and updated  Review of Systems  Constitutional: Positive for unexpected weight change.  HENT: Negative.   Eyes: Negative.   Respiratory: Negative for cough, chest tightness and shortness of breath.   Cardiovascular: Negative for chest pain, palpitations and leg swelling.  Gastrointestinal: Negative for abdominal distention, abdominal pain, constipation, diarrhea, nausea and vomiting.  Musculoskeletal: Negative.   Skin: Negative.   Neurological: Negative.   Psychiatric/Behavioral: Negative.     Objective:  Physical Exam Constitutional:      Appearance: She is well-developed and well-nourished.  HENT:     Head: Normocephalic and atraumatic.  Eyes:     Extraocular Movements: EOM normal.  Cardiovascular:     Rate and Rhythm: Normal rate and regular rhythm.  Pulmonary:     Effort: Pulmonary effort is normal. No respiratory distress.     Breath sounds: Normal breath sounds. No wheezing or rales.  Abdominal:     General: Bowel sounds are normal. There is no distension.     Palpations: Abdomen is soft.     Tenderness: There is no abdominal tenderness. There is no rebound.  Musculoskeletal:        General: No edema.     Cervical back: Normal range of motion.  Skin:    General: Skin is warm and dry.  Neurological:     Mental Status: She is alert and oriented to person, place, and time.     Coordination: Coordination normal.  Psychiatric:        Mood and Affect: Mood and affect normal.     Vitals:   11/19/20 0949  BP: 102/70  Pulse: 81  Resp: 18  Temp: 98.2 F (36.8 C)  TempSrc: Oral  SpO2: 97%  Weight: 101 lb 9.6 oz (46.1 kg)  Height: 5\' 4"  (1.626 m)    This visit occurred during the SARS-CoV-2 public health emergency.  Safety protocols were in place, including screening  questions prior to the visit, additional usage of staff PPE, and extensive cleaning of exam room while observing appropriate contact time as indicated for disinfecting solutions.   Assessment & Plan:

## 2020-11-20 ENCOUNTER — Ambulatory Visit
Admission: RE | Admit: 2020-11-20 | Discharge: 2020-11-20 | Disposition: A | Discharge status: Home / Self Care | Payer: Managed Care, Other (non HMO) | Source: Ambulatory Visit | Attending: Internal Medicine | Admitting: Internal Medicine

## 2020-11-20 DIAGNOSIS — Z1231 Encounter for screening mammogram for malignant neoplasm of breast: Secondary | ICD-10-CM

## 2020-11-21 ENCOUNTER — Ambulatory Visit
Admission: RE | Admit: 2020-11-21 | Discharge: 2020-11-21 | Disposition: A | Discharge status: Home / Self Care | Payer: Managed Care, Other (non HMO) | Source: Ambulatory Visit | Attending: General Surgery | Admitting: General Surgery

## 2020-11-21 DIAGNOSIS — Z1239 Encounter for other screening for malignant neoplasm of breast: Secondary | ICD-10-CM

## 2020-11-21 MED ORDER — GADOBUTROL 1 MMOL/ML IV SOLN
5.0000 mL | Freq: Once | INTRAVENOUS | Status: AC | PRN
  Administered 2020-11-21: 5 mL via INTRAVENOUS

## 2020-11-22 ENCOUNTER — Encounter: Payer: Managed Care, Other (non HMO) | Admitting: Internal Medicine

## 2020-12-20 ENCOUNTER — Encounter: Payer: Self-pay | Admitting: Internal Medicine

## 2020-12-26 MED ORDER — TACROLIMUS 0.1 % EX CREA: TOPICAL_CREAM | CUTANEOUS | 11 refills | Status: DC

## 2021-02-26 ENCOUNTER — Encounter: Payer: Self-pay | Admitting: Internal Medicine

## 2021-03-07 ENCOUNTER — Encounter: Payer: Self-pay | Admitting: Internal Medicine

## 2021-03-07 ENCOUNTER — Ambulatory Visit: Payer: Managed Care, Other (non HMO) | Admitting: Internal Medicine

## 2021-03-07 ENCOUNTER — Other Ambulatory Visit: Payer: Self-pay

## 2021-03-07 VITALS — BP 110/62 | HR 81 | Temp 98.9°F | Ht 64.0 in | Wt 103.6 lb

## 2021-03-07 DIAGNOSIS — R21 Rash and other nonspecific skin eruption: Secondary | ICD-10-CM | POA: Diagnosis not present

## 2021-03-07 MED ORDER — FLUCONAZOLE 150 MG PO TABS: 150.0000 mg | ORAL_TABLET | ORAL | 0 refills | Status: AC

## 2021-03-07 NOTE — Progress Notes (Signed)
   Subjective:   Patient ID: [name] [name], female    DOB: 1966/08/27, 55 y.o.   MRN: [id number]  HPI The patient is a 55 YO female coming in for ongoing rash on the neck and face which has not been responsive to steroid creams. She used desonide which helped on her face for a short time then stopped. She now has a small lump on her neck by the rash. Denies muscle or joint pains. No fevers or chills or weight change.   Review of Systems  Constitutional: Negative.   HENT: Negative.    Eyes: Negative.   Respiratory:  Negative for cough, chest tightness and shortness of breath.   Cardiovascular:  Negative for chest pain, palpitations and leg swelling.  Gastrointestinal:  Negative for abdominal distention, abdominal pain, constipation, diarrhea, nausea and vomiting.  Musculoskeletal: Negative.   Skin:  Positive for rash.  Neurological: Negative.   Psychiatric/Behavioral: Negative.     Objective:  Physical Exam Constitutional:      Appearance: She is well-developed.  HENT:     Head: Normocephalic and atraumatic.  Neck:     Comments: LAD left cervical soft and mobile <1 cm Cardiovascular:     Rate and Rhythm: Normal rate and regular rhythm.  Pulmonary:     Effort: Pulmonary effort is normal. No respiratory distress.     Breath sounds: Normal breath sounds. No wheezing or rales.  Abdominal:     General: Bowel sounds are normal. There is no distension.     Palpations: Abdomen is soft.     Tenderness: There is no abdominal tenderness. There is no rebound.  Musculoskeletal:     Cervical back: Normal range of motion.  Skin:    General: Skin is warm and dry.     Findings: Rash present.     Comments: Rash on the neck and face  Neurological:     Mental Status: She is alert and oriented to person, place, and time.     Coordination: Coordination normal.    Vitals:   03/07/21 1325  BP: 110/62  Pulse: 81  Temp: 98.9 F (37.2 C)  TempSrc: Oral  SpO2: 98%  Weight: 103 lb 9.6 oz  (47 kg)  Height: 5\' 4"  (1.626 m)    This visit occurred during the SARS-CoV-2 public health emergency.  Safety protocols were in place, including screening questions prior to the visit, additional usage of staff PPE, and extensive cleaning of exam room while observing appropriate contact time as indicated for disinfecting solutions.   Assessment & Plan:

## 2021-03-07 NOTE — Assessment & Plan Note (Signed)
Rx diflucan to try to see if there is a fungal component. May be autoimmune so checking ANA and panel to rule out. Seeing dermatology on Monday. If not improved she will let us know.

## 2021-03-07 NOTE — Patient Instructions (Signed)
We will check the blood work for autoimmune disease.   We have sent in diflucan to try taking today which is for fungus/yeast. If this helps in the next 2-3 days take all 3 (1 today, 1 Monday, 1 Thursday). If it does not help just take the one today.

## 2021-03-10 ENCOUNTER — Ambulatory Visit: Payer: Managed Care, Other (non HMO) | Admitting: Internal Medicine

## 2021-03-11 ENCOUNTER — Other Ambulatory Visit: Payer: Self-pay | Admitting: Internal Medicine

## 2021-03-11 ENCOUNTER — Encounter: Payer: Self-pay | Admitting: Internal Medicine

## 2021-03-11 DIAGNOSIS — R768 Other specified abnormal immunological findings in serum: Secondary | ICD-10-CM

## 2021-03-11 LAB — ANA, IFA COMPREHENSIVE PANEL
Anti Nuclear Antibody (ANA): NEGATIVE
ENA SM Ab Ser-aCnc: <1 AI
SM/RNP: <1 AI
SSA (Ro) (ENA) Antibody, IgG: <1 AI
SSB (La) (ENA) Antibody, IgG: <1 AI
Scleroderma (Scl-70) (ENA) Antibody, IgG: <1 AI
ds DNA Ab: 13 IU/mL — ABNORMAL HIGH

## 2021-04-15 ENCOUNTER — Encounter: Payer: Self-pay | Admitting: Internal Medicine

## 2021-04-21 ENCOUNTER — Telehealth: Payer: Self-pay | Admitting: Internal Medicine

## 2021-04-21 NOTE — Telephone Encounter (Signed)
Faxed- 7.29.2022, routed to CMA. Can be found in Media

## 2021-04-22 NOTE — Telephone Encounter (Signed)
Forms placed in [facility] box.

## 2021-06-30 ENCOUNTER — Encounter: Payer: Self-pay | Admitting: Internal Medicine

## 2021-07-22 ENCOUNTER — Other Ambulatory Visit: Payer: Self-pay | Admitting: Internal Medicine

## 2021-07-22 DIAGNOSIS — Z1231 Encounter for screening mammogram for malignant neoplasm of breast: Secondary | ICD-10-CM

## 2021-11-07 ENCOUNTER — Encounter: Payer: Self-pay | Admitting: Internal Medicine

## 2021-11-24 ENCOUNTER — Other Ambulatory Visit: Payer: Self-pay

## 2021-11-24 ENCOUNTER — Ambulatory Visit (INDEPENDENT_AMBULATORY_CARE_PROVIDER_SITE_OTHER): Payer: Managed Care, Other (non HMO) | Admitting: Internal Medicine

## 2021-11-24 ENCOUNTER — Encounter: Payer: Self-pay | Admitting: Internal Medicine

## 2021-11-24 VITALS — BP 112/64 | HR 74 | Resp 18 | Ht 64.0 in | Wt 105.8 lb

## 2021-11-24 DIAGNOSIS — Z1231 Encounter for screening mammogram for malignant neoplasm of breast: Secondary | ICD-10-CM

## 2021-11-24 DIAGNOSIS — Z0000 Encounter for general adult medical examination without abnormal findings: Secondary | ICD-10-CM

## 2021-11-24 DIAGNOSIS — R21 Rash and other nonspecific skin eruption: Secondary | ICD-10-CM | POA: Diagnosis not present

## 2021-11-24 DIAGNOSIS — K219 Gastro-esophageal reflux disease without esophagitis: Secondary | ICD-10-CM | POA: Diagnosis not present

## 2021-11-24 DIAGNOSIS — K8020 Calculus of gallbladder without cholecystitis without obstruction: Secondary | ICD-10-CM

## 2021-11-24 LAB — LIPID PANEL
Cholesterol: 233 mg/dL — ABNORMAL HIGH (ref 0–200)
HDL: 82.6 mg/dL (ref >39.00)
LDL Cholesterol: 137 mg/dL — ABNORMAL HIGH (ref 0–99)
NonHDL: 150.64
Total CHOL/HDL Ratio: 3
Triglycerides: 66 mg/dL (ref 0.0–149.0)
VLDL: 13.2 mg/dL (ref 0.0–40.0)

## 2021-11-24 LAB — COMPREHENSIVE METABOLIC PANEL
ALT: 14 U/L (ref 0–35)
AST: 18 U/L (ref 0–37)
Albumin: 4.4 g/dL (ref 3.5–5.2)
Alkaline Phosphatase: 45 U/L (ref 39–117)
BUN: 20 mg/dL (ref 6–23)
CO2: 31 mEq/L (ref 19–32)
Calcium: 9.2 mg/dL (ref 8.4–10.5)
Chloride: 102 mEq/L (ref 96–112)
Creatinine, Ser: 0.83 mg/dL (ref 0.40–1.20)
GFR: 79.91 mL/min (ref >60.00)
Glucose, Bld: 89 mg/dL (ref 70–99)
Potassium: 3.8 mEq/L (ref 3.5–5.1)
Sodium: 139 mEq/L (ref 135–145)
Total Bilirubin: 0.5 mg/dL (ref 0.2–1.2)
Total Protein: 7.3 g/dL (ref 6.0–8.3)

## 2021-11-24 LAB — CBC
HCT: 38.2 % (ref 36.0–46.0)
Hemoglobin: 12.7 g/dL (ref 12.0–15.0)
MCHC: 33.4 g/dL (ref 30.0–36.0)
MCV: 90 fl (ref 78.0–100.0)
Platelets: 259 10*3/uL (ref 150.0–400.0)
RBC: 4.25 Mil/uL (ref 3.87–5.11)
RDW: 13.3 % (ref 11.5–15.5)
WBC: 5.4 10*3/uL (ref 4.0–10.5)

## 2021-11-24 LAB — VITAMIN D 25 HYDROXY (VIT D DEFICIENCY, FRACTURES): VITD: 50.66 ng/mL (ref 30.00–100.00)

## 2021-11-24 LAB — TSH: TSH: 10.37 u[IU]/mL — ABNORMAL HIGH (ref 0.35–5.50)

## 2021-11-24 LAB — VITAMIN B12: Vitamin B-12: 710 pg/mL (ref 211–911)

## 2021-11-24 LAB — T4, FREE: Free T4: 0.74 ng/dL (ref 0.60–1.60)

## 2021-11-24 MED ORDER — EUCRISA 2 % EX OINT: TOPICAL_OINTMENT | CUTANEOUS | 6 refills | Status: AC

## 2021-11-24 NOTE — Progress Notes (Signed)
? ?  Subjective:  ? ?Patient ID: [name] [name], female    DOB: March 22, 1966, 56 y.o.   MRN: [id number] ? ?HPI ?The patient is a 56 YO female coming in for physical.  ? ?PMH, FMH, social history reviewed and updated ? ?Review of Systems  ?Constitutional: Negative.   ?HENT: Negative.    ?Eyes: Negative.   ?Respiratory:  Negative for cough, chest tightness and shortness of breath.   ?Cardiovascular:  Negative for chest pain, palpitations and leg swelling.  ?Gastrointestinal:  Negative for abdominal distention, abdominal pain, constipation, diarrhea, nausea and vomiting.  ?Musculoskeletal: Negative.   ?Skin:  Positive for rash.  ?Neurological: Negative.   ?Psychiatric/Behavioral: Negative.    ? ?Objective:  ?Physical Exam ?Constitutional:   ?   Appearance: She is well-developed.  ?HENT:  ?   Head: Normocephalic and atraumatic.  ?Cardiovascular:  ?   Rate and Rhythm: Normal rate and regular rhythm.  ?Pulmonary:  ?   Effort: Pulmonary effort is normal. No respiratory distress.  ?   Breath sounds: Normal breath sounds. No wheezing or rales.  ?Abdominal:  ?   General: Bowel sounds are normal. There is no distension.  ?   Palpations: Abdomen is soft.  ?   Tenderness: There is no abdominal tenderness. There is no rebound.  ?Musculoskeletal:  ?   Cervical back: Normal range of motion.  ?Skin: ?   General: Skin is warm and dry.  ?   Findings: Rash present.  ?Neurological:  ?   Mental Status: She is alert and oriented to person, place, and time.  ?   Coordination: Coordination normal.  ? ? ?Vitals:  ? 11/24/21 0953  ?BP: 112/64  ?Pulse: 74  ?Resp: 18  ?SpO2: 100%  ?Weight: 105 lb 12.8 oz (48 kg)  ?Height: 5\' 4"  (1.626 m)  ? ? ?This visit occurred during the SARS-CoV-2 public health emergency.  Safety protocols were in place, including screening questions prior to the visit, additional usage of staff PPE, and extensive cleaning of exam room while observing appropriate contact time as indicated for disinfecting solutions.   ? ?Assessment & Plan:  ? ?

## 2021-11-24 NOTE — Patient Instructions (Signed)
We have sent in the eucrisa to use topically twice a day which is a non-steroid and can be used anywhere. ? ? ?

## 2021-11-25 ENCOUNTER — Encounter: Payer: Self-pay | Admitting: Internal Medicine

## 2021-11-25 ENCOUNTER — Ambulatory Visit
Admission: RE | Admit: 2021-11-25 | Discharge: 2021-11-25 | Disposition: A | Discharge status: Home / Self Care | Payer: Managed Care, Other (non HMO) | Source: Ambulatory Visit | Attending: Internal Medicine | Admitting: Internal Medicine

## 2021-11-25 DIAGNOSIS — Z1231 Encounter for screening mammogram for malignant neoplasm of breast: Secondary | ICD-10-CM

## 2021-11-25 NOTE — Assessment & Plan Note (Signed)
Rx generic eucrisa as this is steroid sparing for rash on the face and neck. She is seeing dermatology Friday. No arm/muscle weakness.  ?

## 2021-11-25 NOTE — Assessment & Plan Note (Signed)
Flu shot declines. Covid-19 declines. Shingrix declines. Tetanus declines. Colonoscopy up to date. Mammogram up to date, pap smear up to date. Counseled about sun safety and mole surveillance. Counseled about the dangers of distracted driving. Given 10 year screening recommendations.   

## 2021-11-25 NOTE — Assessment & Plan Note (Signed)
Taking pepcid 20 mg daily and protonix as needed.  ?

## 2021-11-25 NOTE — Assessment & Plan Note (Signed)
Present rare discomfort. No indication for referral to surgery at this time. ?

## 2021-12-01 ENCOUNTER — Encounter: Payer: Managed Care, Other (non HMO) | Admitting: Internal Medicine

## 2022-03-11 ENCOUNTER — Other Ambulatory Visit: Payer: Self-pay | Admitting: Obstetrics and Gynecology

## 2022-03-11 DIAGNOSIS — N6001 Solitary cyst of right breast: Secondary | ICD-10-CM

## 2022-03-12 ENCOUNTER — Ambulatory Visit
Admission: RE | Admit: 2022-03-12 | Discharge: 2022-03-12 | Disposition: A | Discharge status: Home / Self Care | Payer: Managed Care, Other (non HMO) | Source: Ambulatory Visit | Attending: Obstetrics and Gynecology | Admitting: Obstetrics and Gynecology

## 2022-03-12 DIAGNOSIS — N6001 Solitary cyst of right breast: Secondary | ICD-10-CM

## 2022-07-30 ENCOUNTER — Other Ambulatory Visit: Payer: Self-pay | Admitting: Internal Medicine

## 2022-07-30 DIAGNOSIS — Z1231 Encounter for screening mammogram for malignant neoplasm of breast: Secondary | ICD-10-CM

## 2022-08-06 IMAGING — MG MM DIGITAL DIAGNOSTIC UNILAT*R* W/ TOMO W/ CAD
6 series · 6 of 18 positions shown · non-contrast
Comparison: Previous exam(s).

CLINICAL DATA: Palpable lump in the right breast

EXAM:
DIGITAL DIAGNOSTIC UNILATERAL RIGHT MAMMOGRAM WITH TOMOSYNTHESIS AND
CAD; ULTRASOUND RIGHT BREAST LIMITED
TECHNIQUE: Right digital diagnostic mammography and breast tomosynthesis was
performed. The images were evaluated with computer-aided detection.;
Targeted ultrasound examination of the right breast was performed

[R MLO synth-2D]
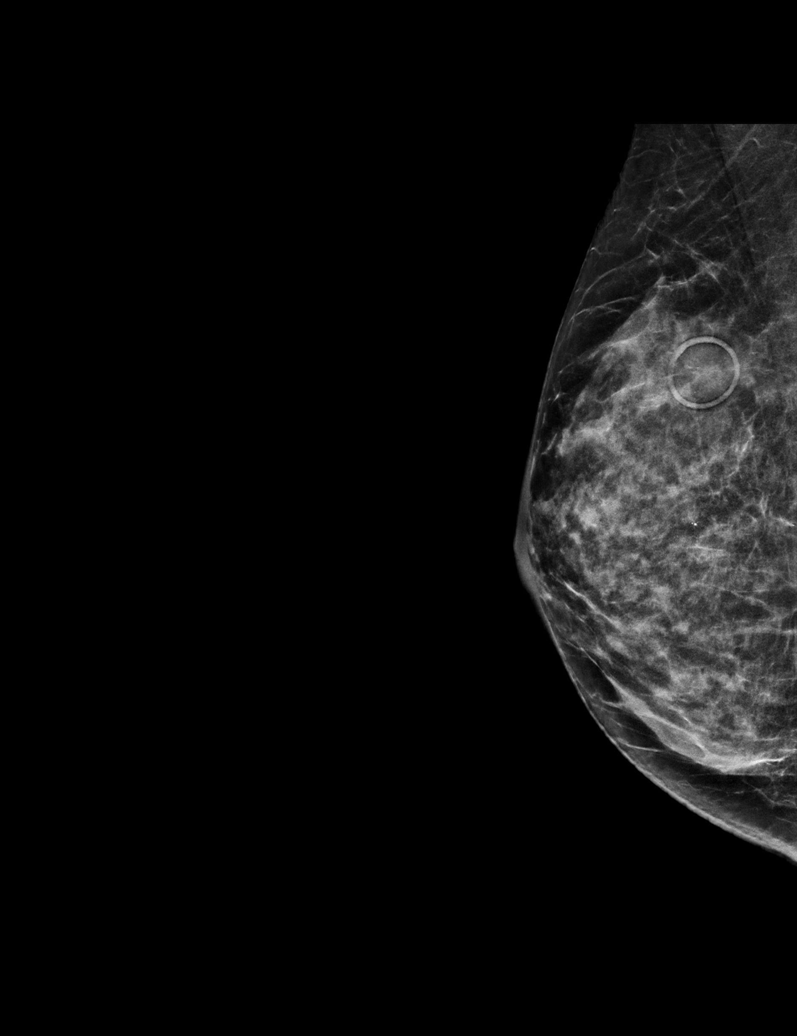

[R CC synth-2D (1 of 2)]
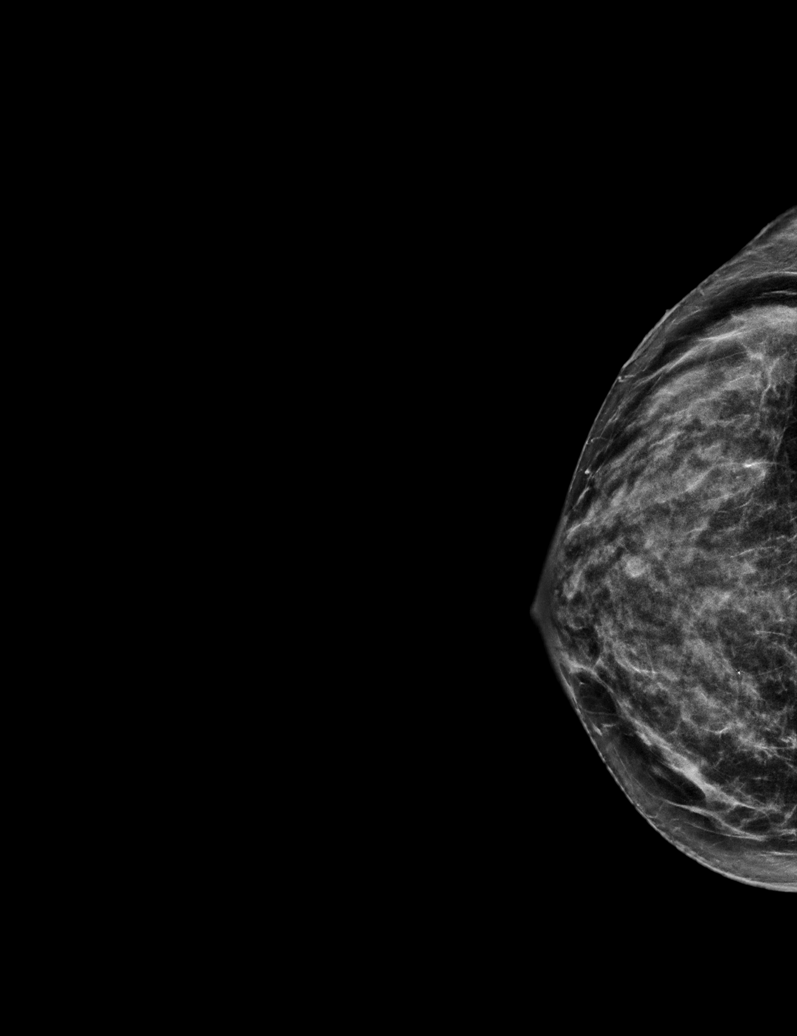

[R CC synth-2D (2 of 2)]
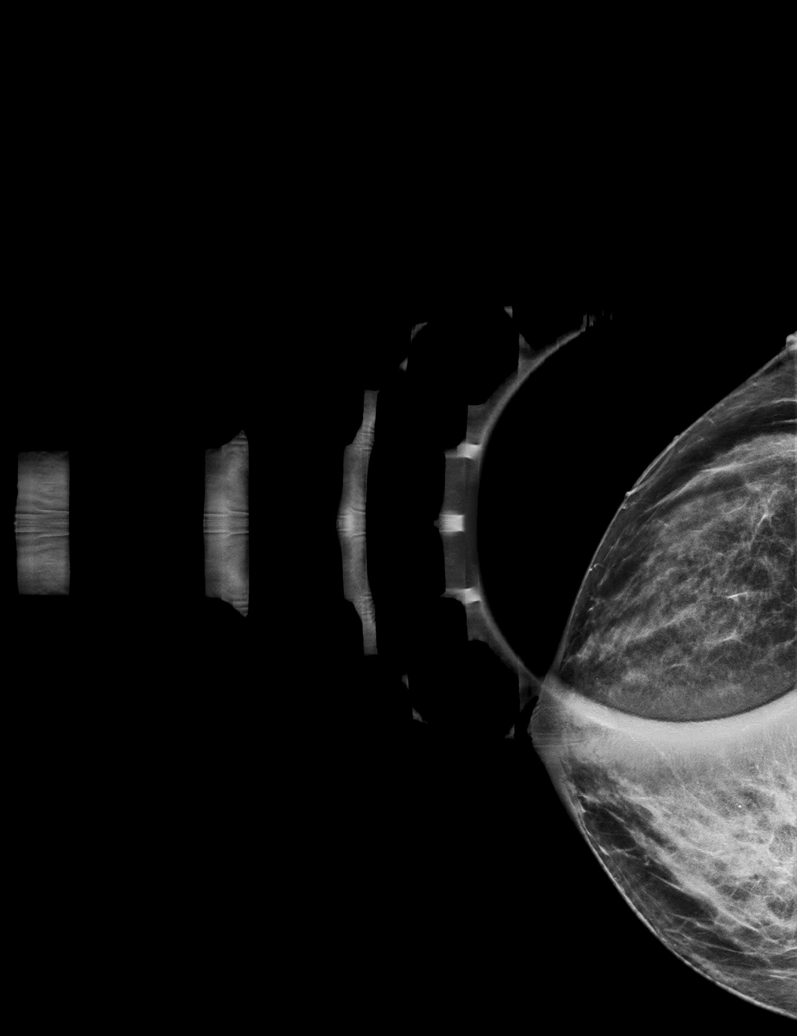

[R CC tomo (1 of 2) · tomo slice 27/53.0]
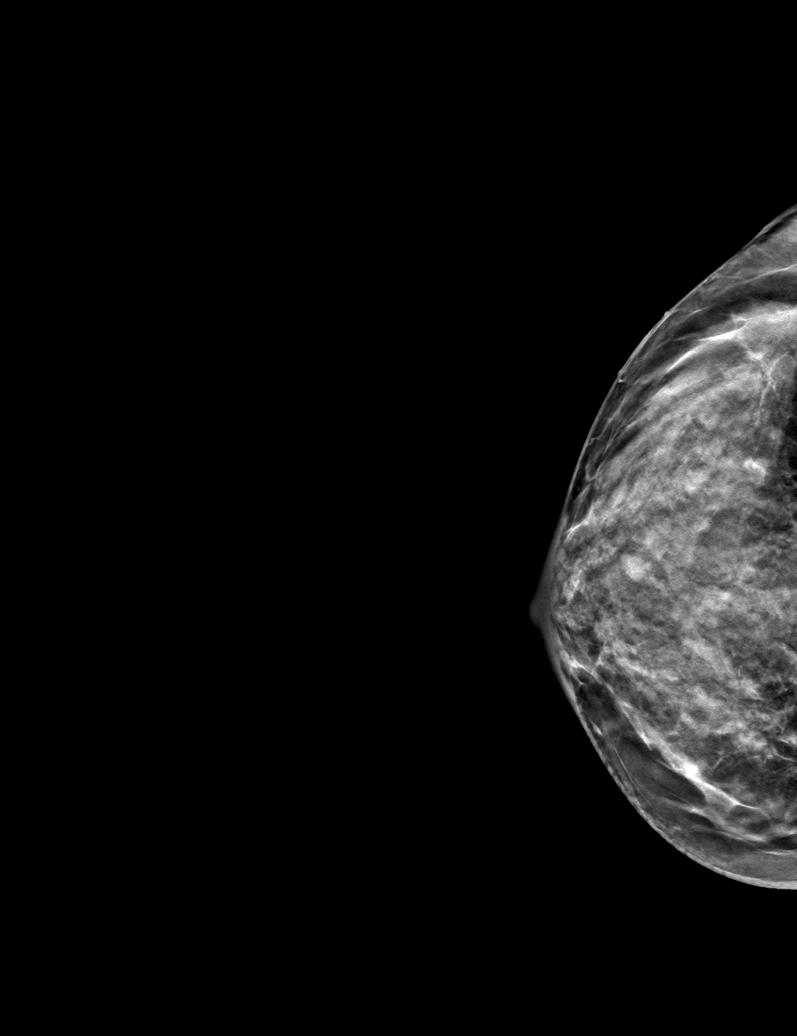

[R CC tomo (2 of 2) · tomo slice 25/50.0]
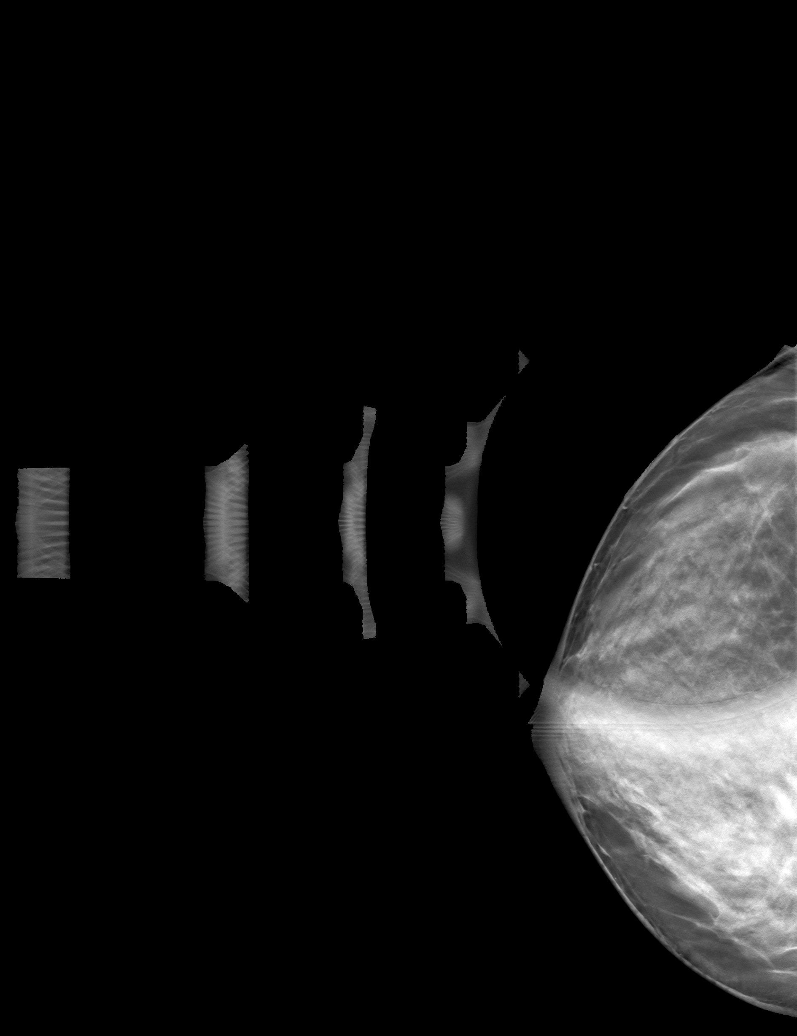

[R MLO tomo · tomo slice 26/51.0]
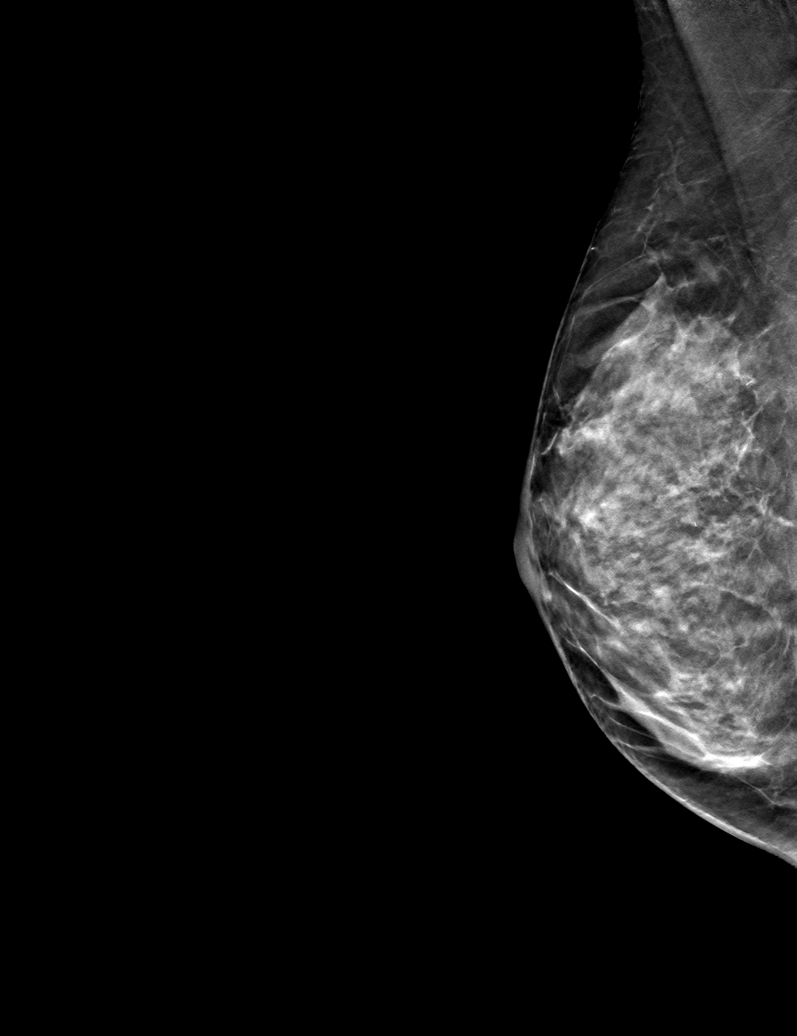

[6 of 18 positions shown; findings below may reference images not displayed]

ACR Breast Density Category c: The breast tissue is heterogeneously
dense, which may obscure small masses.
FINDINGS: No suspicious masses, calcifications, or distortion are identified
mammographically.

Targeted ultrasound is performed, showing dense glandular tissue in
the palpable lump marked by the patient's physician. No evidence of
malignancy. No masses identified.
IMPRESSION: No mammographic or sonographic evidence of malignancy.

RECOMMENDATION:
Treatment of the palpable lump should be based on clinical and
physical exam given lack of imaging findings. Recommend annual
screening mammography.

I have discussed the findings and recommendations with the patient.
If applicable, a reminder letter will be sent to the patient
regarding the next appointment.

BI-RADS CATEGORY  1: Negative.

## 2022-11-23 ENCOUNTER — Ambulatory Visit
Admission: RE | Admit: 2022-11-23 | Discharge: 2022-11-23 | Disposition: A | Discharge status: Home / Self Care | Payer: Managed Care, Other (non HMO) | Source: Ambulatory Visit

## 2022-11-23 DIAGNOSIS — Z1231 Encounter for screening mammogram for malignant neoplasm of breast: Secondary | ICD-10-CM

## 2022-11-24 ENCOUNTER — Encounter: Payer: Self-pay | Admitting: Internal Medicine

## 2022-11-24 ENCOUNTER — Ambulatory Visit (INDEPENDENT_AMBULATORY_CARE_PROVIDER_SITE_OTHER): Payer: Managed Care, Other (non HMO) | Admitting: Internal Medicine

## 2022-11-24 VITALS — BP 100/80 | HR 75 | Temp 98.4°F | Ht 64.0 in | Wt 108.1 lb

## 2022-11-24 DIAGNOSIS — Z0000 Encounter for general adult medical examination without abnormal findings: Secondary | ICD-10-CM | POA: Diagnosis not present

## 2022-11-24 DIAGNOSIS — R21 Rash and other nonspecific skin eruption: Secondary | ICD-10-CM

## 2022-11-24 DIAGNOSIS — R7989 Other specified abnormal findings of blood chemistry: Secondary | ICD-10-CM | POA: Diagnosis not present

## 2022-11-24 DIAGNOSIS — K219 Gastro-esophageal reflux disease without esophagitis: Secondary | ICD-10-CM

## 2022-11-24 DIAGNOSIS — K8020 Calculus of gallbladder without cholecystitis without obstruction: Secondary | ICD-10-CM

## 2022-11-24 DIAGNOSIS — Z1239 Encounter for other screening for malignant neoplasm of breast: Secondary | ICD-10-CM

## 2022-11-24 DIAGNOSIS — R0789 Other chest pain: Secondary | ICD-10-CM

## 2022-11-24 LAB — COMPREHENSIVE METABOLIC PANEL
ALT: 15 U/L (ref 0–35)
AST: 20 U/L (ref 0–37)
Albumin: 4.2 g/dL (ref 3.5–5.2)
Alkaline Phosphatase: 42 U/L (ref 39–117)
BUN: 15 mg/dL (ref 6–23)
CO2: 29 mEq/L (ref 19–32)
Calcium: 9.5 mg/dL (ref 8.4–10.5)
Chloride: 101 mEq/L (ref 96–112)
Creatinine, Ser: 0.85 mg/dL (ref 0.40–1.20)
GFR: 77.12 mL/min (ref >60.00)
Glucose, Bld: 91 mg/dL (ref 70–99)
Potassium: 4.1 mEq/L (ref 3.5–5.1)
Sodium: 139 mEq/L (ref 135–145)
Total Bilirubin: 0.4 mg/dL (ref 0.2–1.2)
Total Protein: 7.3 g/dL (ref 6.0–8.3)

## 2022-11-24 LAB — CBC
HCT: 37.9 % (ref 36.0–46.0)
Hemoglobin: 12.9 g/dL (ref 12.0–15.0)
MCHC: 33.9 g/dL (ref 30.0–36.0)
MCV: 89.4 fl (ref 78.0–100.0)
Platelets: 270 10*3/uL (ref 150.0–400.0)
RBC: 4.24 Mil/uL (ref 3.87–5.11)
RDW: 12.8 % (ref 11.5–15.5)
WBC: 5.7 10*3/uL (ref 4.0–10.5)

## 2022-11-24 LAB — LIPID PANEL
Cholesterol: 237 mg/dL — ABNORMAL HIGH (ref 0–200)
HDL: 83.4 mg/dL (ref >39.00)
LDL Cholesterol: 138 mg/dL — ABNORMAL HIGH (ref 0–99)
NonHDL: 153.3
Total CHOL/HDL Ratio: 3
Triglycerides: 77 mg/dL (ref 0.0–149.0)
VLDL: 15.4 mg/dL (ref 0.0–40.0)

## 2022-11-24 LAB — T4, FREE: Free T4: 0.74 ng/dL (ref 0.60–1.60)

## 2022-11-24 LAB — VITAMIN B12: Vitamin B-12: 630 pg/mL (ref 211–911)

## 2022-11-24 LAB — TSH: TSH: 7.62 u[IU]/mL — ABNORMAL HIGH (ref 0.35–5.50)

## 2022-11-24 MED ORDER — PANTOPRAZOLE SODIUM 20 MG PO TBEC: 20.0000 mg | DELAYED_RELEASE_TABLET | Freq: Every day | ORAL | 3 refills | Status: AC

## 2022-11-24 NOTE — Assessment & Plan Note (Signed)
Flu shot declines. Covid-19 counseled. Shingrix declines. Tetanus declines. Colonoscopy getting later this year with Dr. Collene Mares. Mammogram done yesterday, pap smear up to date no further indicated. Counseled about sun safety and mole surveillance. Counseled about the dangers of distracted driving. Given 10 year screening recommendations.

## 2022-11-24 NOTE — Assessment & Plan Note (Signed)
Checking TSH and free T4 and no clinical symptoms at this time.

## 2022-11-24 NOTE — Progress Notes (Signed)
   Subjective:   Patient ID: [name] [name], female    DOB: 05-Jun-1966, 57 y.o.   MRN: [id number]  HPI The patient is here for physical.  PMH, [facility], social history reviewed and updated  Review of Systems  Constitutional: Negative.   HENT: Negative.    Eyes: Negative.   Respiratory:  Negative for cough, chest tightness and shortness of breath.   Cardiovascular:  Negative for chest pain, palpitations and leg swelling.  Gastrointestinal:  Negative for abdominal distention, abdominal pain, constipation, diarrhea, nausea and vomiting.  Musculoskeletal: Negative.   Skin: Negative.   Neurological: Negative.   Psychiatric/Behavioral: Negative.      Objective:  Physical Exam Constitutional:      Appearance: She is well-developed.  HENT:     Head: Normocephalic and atraumatic.  Cardiovascular:     Rate and Rhythm: Normal rate and regular rhythm.  Pulmonary:     Effort: Pulmonary effort is normal. No respiratory distress.     Breath sounds: Normal breath sounds. No wheezing or rales.  Abdominal:     General: Bowel sounds are normal. There is no distension.     Palpations: Abdomen is soft.     Tenderness: There is no abdominal tenderness. There is no rebound.  Musculoskeletal:     Cervical back: Normal range of motion.  Skin:    General: Skin is warm and dry.  Neurological:     Mental Status: She is alert and oriented to person, place, and time.     Coordination: Coordination normal.     Vitals:   11/24/22 0803  BP: 100/80  Pulse: 75  Temp: 98.4 F (36.9 C)  TempSrc: Oral  SpO2: 98%  Weight: 108 lb 2 oz (49 kg)  Height: '5\' 4"'$  (1.626 m)    Assessment & Plan:

## 2022-11-24 NOTE — Assessment & Plan Note (Signed)
Improving well and almost resolved.

## 2022-11-24 NOTE — Assessment & Plan Note (Signed)
Rare symptoms and monitors diet.

## 2022-11-24 NOTE — Assessment & Plan Note (Signed)
Yearly mammogram.

## 2022-11-24 NOTE — Assessment & Plan Note (Signed)
Refilled protonix which she uses prn only. Mostly using pepcid which controls symptoms adequately most of time.

## 2022-11-25 ENCOUNTER — Encounter: Payer: Self-pay | Admitting: Internal Medicine

## 2022-11-25 LAB — HM MAMMOGRAPHY

## 2022-11-26 ENCOUNTER — Other Ambulatory Visit (INDEPENDENT_AMBULATORY_CARE_PROVIDER_SITE_OTHER): Payer: Managed Care, Other (non HMO)

## 2022-11-26 DIAGNOSIS — R21 Rash and other nonspecific skin eruption: Secondary | ICD-10-CM

## 2022-11-26 LAB — VITAMIN D 25 HYDROXY (VIT D DEFICIENCY, FRACTURES): VITD: 40.29 ng/mL (ref 30.00–100.00)

## 2022-11-26 NOTE — Addendum Note (Signed)
Addended by: Pricilla Holm A on: 11/26/2022 08:05 AM   Modules accepted: Orders

## 2022-11-27 NOTE — Addendum Note (Signed)
Addended by: Pricilla Holm A on: 11/27/2022 12:53 PM   Modules accepted: Orders

## 2022-12-07 ENCOUNTER — Encounter: Payer: Managed Care, Other (non HMO) | Admitting: Internal Medicine

## 2023-01-08 ENCOUNTER — Encounter: Payer: Self-pay | Admitting: Internal Medicine

## 2023-01-08 NOTE — Telephone Encounter (Signed)
Sorry, I am just not comfortable with the amount of documentation available to review regarding this request, and would not feel safe in prescribing for this patient at this time   thanks

## 2023-03-01 ENCOUNTER — Telehealth (HOSPITAL_COMMUNITY): Payer: Self-pay

## 2023-03-01 NOTE — Telephone Encounter (Signed)
Spoke with the patient, she stated that she would be here for her test. Asked to call back with any questions. S.Remas Sobel EMTP/CCT

## 2023-03-08 ENCOUNTER — Ambulatory Visit (HOSPITAL_COMMUNITY): Payer: Managed Care, Other (non HMO) | Attending: Cardiology

## 2023-03-08 DIAGNOSIS — R0789 Other chest pain: Secondary | ICD-10-CM | POA: Diagnosis present

## 2023-03-08 LAB — EXERCISE TOLERANCE TEST
Angina Index: 0
Base ST Depression (mm): 0 mm
Duke Treadmill Score: 9
Estimated workload: 10.1
Exercise duration (min): 9 min
Exercise duration (sec): 0 s
Peak HR: 160 {beats}/min
Percent HR: 98 %
Rest HR: 83 {beats}/min
ST Depression (mm): 0 mm

## 2023-03-30 ENCOUNTER — Encounter: Payer: Self-pay | Admitting: Internal Medicine

## 2023-04-09 DIAGNOSIS — Z0289 Encounter for other administrative examinations: Secondary | ICD-10-CM

## 2023-04-16 NOTE — Telephone Encounter (Signed)
Patient has picked up forms.

## 2023-10-22 ENCOUNTER — Other Ambulatory Visit: Payer: Self-pay | Admitting: Obstetrics and Gynecology

## 2023-10-22 DIAGNOSIS — Z1231 Encounter for screening mammogram for malignant neoplasm of breast: Secondary | ICD-10-CM

## 2023-11-22 ENCOUNTER — Ambulatory Visit
Admission: RE | Admit: 2023-11-22 | Discharge: 2023-11-22 | Disposition: A | Discharge status: Home / Self Care | Payer: Managed Care, Other (non HMO) | Source: Ambulatory Visit

## 2023-11-22 DIAGNOSIS — Z1231 Encounter for screening mammogram for malignant neoplasm of breast: Secondary | ICD-10-CM

## 2023-11-23 ENCOUNTER — Ambulatory Visit (INDEPENDENT_AMBULATORY_CARE_PROVIDER_SITE_OTHER): Payer: Managed Care, Other (non HMO) | Admitting: Internal Medicine

## 2023-11-23 ENCOUNTER — Encounter: Payer: Self-pay | Admitting: Internal Medicine

## 2023-11-23 ENCOUNTER — Encounter: Payer: Managed Care, Other (non HMO) | Admitting: Internal Medicine

## 2023-11-23 VITALS — BP 114/80 | HR 79 | Temp 98.1°F | Ht 64.0 in | Wt 110.0 lb

## 2023-11-23 DIAGNOSIS — R7989 Other specified abnormal findings of blood chemistry: Secondary | ICD-10-CM

## 2023-11-23 DIAGNOSIS — K219 Gastro-esophageal reflux disease without esophagitis: Secondary | ICD-10-CM | POA: Diagnosis not present

## 2023-11-23 DIAGNOSIS — K8020 Calculus of gallbladder without cholecystitis without obstruction: Secondary | ICD-10-CM

## 2023-11-23 DIAGNOSIS — Z0000 Encounter for general adult medical examination without abnormal findings: Secondary | ICD-10-CM | POA: Diagnosis not present

## 2023-11-23 LAB — LIPID PANEL
Cholesterol: 259 mg/dL — ABNORMAL HIGH (ref 0–200)
HDL: 84.8 mg/dL (ref >39.00)
LDL Cholesterol: 159 mg/dL — ABNORMAL HIGH (ref 0–99)
NonHDL: 173.84
Total CHOL/HDL Ratio: 3
Triglycerides: 76 mg/dL (ref 0.0–149.0)
VLDL: 15.2 mg/dL (ref 0.0–40.0)

## 2023-11-23 LAB — CBC
HCT: 41 % (ref 36.0–46.0)
Hemoglobin: 13.5 g/dL (ref 12.0–15.0)
MCHC: 33 g/dL (ref 30.0–36.0)
MCV: 91.6 fl (ref 78.0–100.0)
Platelets: 280 10*3/uL (ref 150.0–400.0)
RBC: 4.47 Mil/uL (ref 3.87–5.11)
RDW: 12.9 % (ref 11.5–15.5)
WBC: 5.8 10*3/uL (ref 4.0–10.5)

## 2023-11-23 LAB — COMPREHENSIVE METABOLIC PANEL
ALT: 16 U/L (ref 0–35)
AST: 20 U/L (ref 0–37)
Albumin: 4.4 g/dL (ref 3.5–5.2)
Alkaline Phosphatase: 48 U/L (ref 39–117)
BUN: 16 mg/dL (ref 6–23)
CO2: 30 meq/L (ref 19–32)
Calcium: 9.4 mg/dL (ref 8.4–10.5)
Chloride: 102 meq/L (ref 96–112)
Creatinine, Ser: 0.76 mg/dL (ref 0.40–1.20)
GFR: 87.59 mL/min (ref >60.00)
Glucose, Bld: 93 mg/dL (ref 70–99)
Potassium: 3.9 meq/L (ref 3.5–5.1)
Sodium: 138 meq/L (ref 135–145)
Total Bilirubin: 0.4 mg/dL (ref 0.2–1.2)
Total Protein: 7.7 g/dL (ref 6.0–8.3)

## 2023-11-23 LAB — TSH: TSH: 7.88 u[IU]/mL — ABNORMAL HIGH (ref 0.35–5.50)

## 2023-11-23 LAB — VITAMIN D 25 HYDROXY (VIT D DEFICIENCY, FRACTURES): VITD: 48.06 ng/mL (ref 30.00–100.00)

## 2023-11-23 LAB — T4, FREE: Free T4: 0.8 ng/dL (ref 0.60–1.60)

## 2023-11-23 NOTE — Progress Notes (Unsigned)
   Subjective:   Patient ID: [name] [name], female    DOB: [birth year], 58 y.o.   MRN: [id number]  HPI The patient is a 58 YO coming in for physical.  PMH, FMH, social history reviewed and updated  Review of Systems  Constitutional: Negative.   HENT: Negative.    Eyes: Negative.   Respiratory:  Negative for cough, chest tightness and shortness of breath.   Cardiovascular:  Negative for chest pain, palpitations and leg swelling.  Gastrointestinal:  Negative for abdominal distention, abdominal pain, constipation, diarrhea, nausea and vomiting.  Musculoskeletal: Negative.   Skin: Negative.   Neurological: Negative.   Psychiatric/Behavioral: Negative.      Objective:  Physical Exam Constitutional:      Appearance: She is well-developed.  HENT:     Head: Normocephalic and atraumatic.  Cardiovascular:     Rate and Rhythm: Normal rate and regular rhythm.  Pulmonary:     Effort: Pulmonary effort is normal. No respiratory distress.     Breath sounds: Normal breath sounds. No wheezing or rales.  Abdominal:     General: Bowel sounds are normal. There is no distension.     Palpations: Abdomen is soft.     Tenderness: There is no abdominal tenderness. There is no rebound.  Musculoskeletal:     Cervical back: Normal range of motion.  Skin:    General: Skin is warm and dry.  Neurological:     Mental Status: She is alert and oriented to person, place, and time.     Coordination: Coordination normal.     Vitals:   11/23/23 0909  BP: 114/80  Pulse: 79  Temp: 98.1 F (36.7 C)  TempSrc: Oral  SpO2: 99%  Weight: 110 lb (49.9 kg)  Height: 5\' 4"  (1.626 m)    Assessment & Plan:

## 2023-11-25 ENCOUNTER — Encounter: Payer: Managed Care, Other (non HMO) | Admitting: Internal Medicine

## 2023-11-25 NOTE — Assessment & Plan Note (Signed)
No bothersome symptoms.

## 2023-11-25 NOTE — Assessment & Plan Note (Signed)
 Checking TSH and free T4 and discussed parameters around which we might consider treatment.

## 2023-11-25 NOTE — Assessment & Plan Note (Signed)
 Flu shot counseled. Pneumonia counseled. Shingrix counseled. Tetanus counseled. Colonoscopy up to date. Mammogram up to date, pap smear up to date. Counseled about sun safety and mole surveillance. Counseled about the dangers of distracted driving. Given 10 year screening recommendations.

## 2023-11-25 NOTE — Assessment & Plan Note (Signed)
 Uses pepcid and okay to continue.

## 2024-08-28 ENCOUNTER — Encounter: Payer: Self-pay | Admitting: Internal Medicine

## 2024-11-27 ENCOUNTER — Encounter: Payer: Self-pay | Admitting: Internal Medicine

## 2024-11-27 DIAGNOSIS — Z1231 Encounter for screening mammogram for malignant neoplasm of breast: Secondary | ICD-10-CM
